# Patient Record
Sex: Male | Born: 1990 | Race: White | Hispanic: No | Marital: Married | Smoking: Former smoker
Health system: Southern US, Academic
[De-identification: ages and names within clinical notes are randomized; demographics above are authoritative.]

## PROBLEM LIST (undated history)

## (undated) DIAGNOSIS — F419 Anxiety disorder, unspecified: Secondary | ICD-10-CM

## (undated) DIAGNOSIS — Z973 Presence of spectacles and contact lenses: Secondary | ICD-10-CM

## (undated) DIAGNOSIS — I10 Essential (primary) hypertension: Secondary | ICD-10-CM

## (undated) HISTORY — DX: Anxiety disorder, unspecified: F41.9

## (undated) HISTORY — PX: HX WISDOM TEETH EXTRACTION: SHX21

## (undated) HISTORY — DX: Essential (primary) hypertension: I10

## (undated) HISTORY — PX: HX TONSILLECTOMY: SHX27

## (undated) NOTE — Progress Notes (Signed)
 Formatting of this note might be different from the original.  Subjective   Patient ID: Benjamin Mcmahon is a 23 y.o. male presenting to the Urgent Care with a chief complaint of Back Pain (Lower back pain x years.  Flare up today.).    Pt reports left back pain in lower lumbar region with that is chronic in nature, flare this morning having trouble sitting and finding a comfortable position. No known injury     History provided by:  Patient  Back Pain  This is a recurrent problem. The current episode started today. The problem occurs constantly. The problem is unchanged. The pain is present in the lumbar spine. The quality of the pain is described as burning and shooting. The pain radiates to the left knee and left thigh. The pain is at a severity of 3/10. The pain is moderate. The pain is The same all the time. The symptoms are aggravated by sitting and position. Stiffness is present All day. He has tried nothing for the symptoms. The treatment provided no relief.     Objective   BP (!) 150/90   Pulse 94   Temp 37.3 C (99.2 F) (Temporal)   Resp 19   Ht 1.854 m (6' 1)   Wt 111 kg (245 lb)   SpO2 96%   BMI 32.32 kg/m     Physical Exam  Constitutional:       Appearance: Normal appearance. He is normal weight.   HENT:      Head: Normocephalic.      Right Ear: Tympanic membrane, ear canal and external ear normal.      Left Ear: Tympanic membrane, ear canal and external ear normal.      Nose: Nose normal.      Mouth/Throat:      Mouth: Mucous membranes are dry.      Pharynx: Oropharynx is clear.   Eyes:      Conjunctiva/sclera: Conjunctivae normal.   Cardiovascular:      Rate and Rhythm: Normal rate and regular rhythm.      Pulses: Normal pulses.      Heart sounds: Normal heart sounds.   Pulmonary:      Effort: Pulmonary effort is normal.      Breath sounds: Normal breath sounds.   Abdominal:      General: Bowel sounds are normal.      Palpations: Abdomen is soft.   Musculoskeletal:         General: Tenderness  present.      Comments: Left lumbar spine   Bilateral sciatica    Skin:     General: Skin is warm and dry.   Neurological:      General: No focal deficit present.      Mental Status: He is alert and oriented to person, place, and time. Mental status is at baseline.   Psychiatric:         Mood and Affect: Mood normal.         Behavior: Behavior normal.         Thought Content: Thought content normal.         Judgment: Judgment normal.         Assessment & Plan    Assessment & Plan  Bilateral sciatica    Orders:    ketorolac  (Toradol ) injection 30 mg    dexAMETHasone  (Decadron ) injection 10 mg    ketorolac  (Toradol ) injection 30 mg    methylPREDNISolone  (Medrol  Dospak) 4 MG tablets; See  instructions. Take by mouth as directed on label    cyclobenzaprine  (Flexeril ) 5 MG tablet; Take 1 tablet by mouth 3 times a day as needed for muscle spasms for up to 5 days. Do not drive or operate heavy machinery    diclofenac (Voltaren) 75 MG EC tablet; Take 1 tablet by mouth in the morning and 1 tablet before bedtime. Do not crush, chew, or split.    Lumbar spine pain    Orders:    ketorolac  (Toradol ) injection 30 mg    dexAMETHasone  (Decadron ) injection 10 mg    ketorolac  (Toradol ) injection 30 mg    methylPREDNISolone  (Medrol  Dospak) 4 MG tablets; See instructions. Take by mouth as directed on label    cyclobenzaprine  (Flexeril ) 5 MG tablet; Take 1 tablet by mouth 3 times a day as needed for muscle spasms for up to 5 days. Do not drive or operate heavy machinery    diclofenac (Voltaren) 75 MG EC tablet; Take 1 tablet by mouth in the morning and 1 tablet before bedtime. Do not crush, chew, or split.        In-House Lab Results:   No results found for this or any previous visit.     In-House Imaging Reads:        Procedure Documentation:  Procedures     ED Course & MDM   MDM - Medical Decision Making: Reviewed previous Chart  Electronically signed by Avelina Kerns, CRNP at 03/22/2024  1:25 PM EDT

---

## 2020-09-18 IMAGING — MR MRI BRAIN WITHOUT AND WITH CONTRAST
11 of 13 series · 36 of 48 positions shown · IV contrast (gadavist)
Comparison: None available.

﻿EXAM:  MRI BRAIN WITHOUT AND WITH CONTRAST
INDICATION: Suspected pituitary lesion.
TECHNIQUE: Multiplanar multisequential MRI of the brain and pituitary gland was performed without and with 5 mL of Gadavist.

[Series 5: DWI · axial · 5.0mm · 1.35mm/px · z∈[-21,+105]mm · 9 of 88 slices shown (1 of 3)]
[im 1/88]
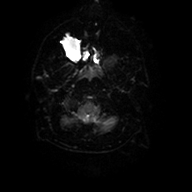
[im 16/88]
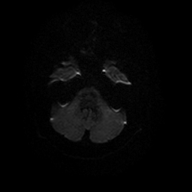
[im 24/88]
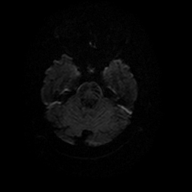
[im 40/88]
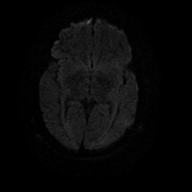
[im 48/88]
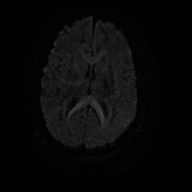
[im 64/88]
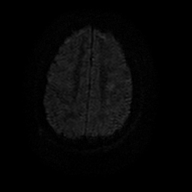
[im 72/88]
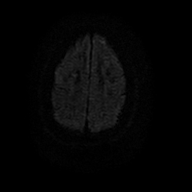
[im 80/88]
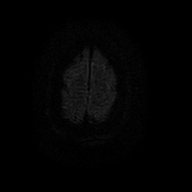
[im 88/88]
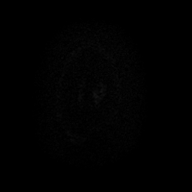

[Series 6: DWI · axial · 5.0mm · 1.35mm/px · z∈[-21,+105]mm · 2 of 22 slices shown (2 of 3)]
[im 1/22]
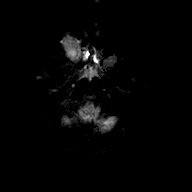
[im 22/22]
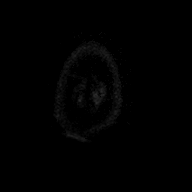

[Series 7: DWI · axial · 5.0mm · 1.35mm/px · z∈[-21,+105]mm · 2 of 20 slices shown (3 of 3)]
[im 1/20]
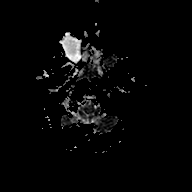
[im 20/20]
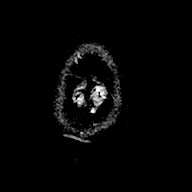

[Series 8: FLAIR · sagittal · 4.0mm · 0.75mm/px · 4 of 26 slices shown (1 of 2)]
[im 1/26]
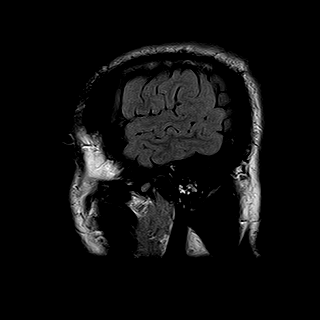
[im 9/26]
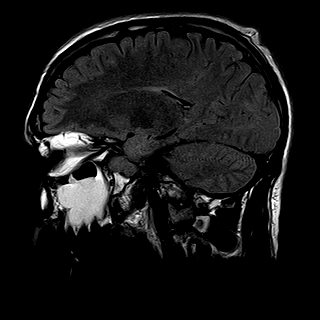
[im 17/26]
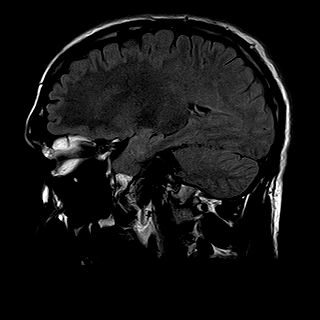
[im 26/26]
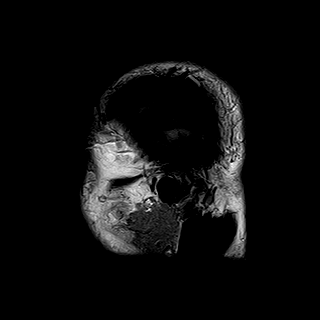

[Series 9: T2 · axial · 5.0mm · 0.43mm/px · z∈[-23,+120]mm · 4 of 25 slices shown]
[im 1/25]
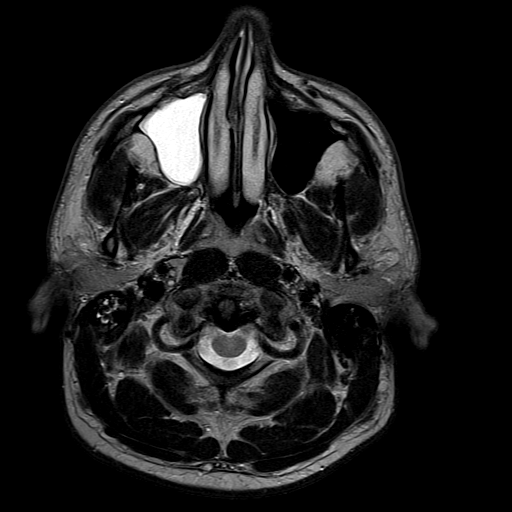
[im 9/25]
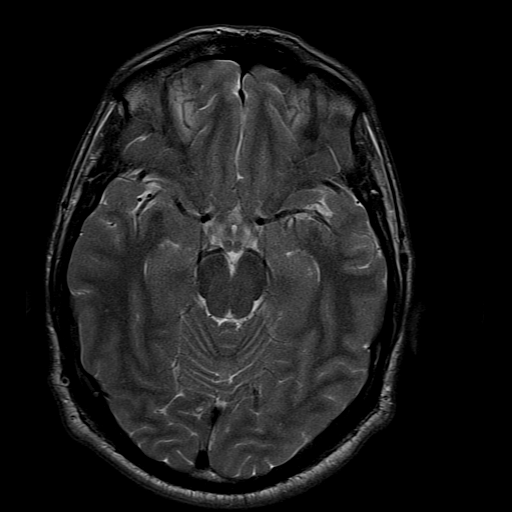
[im 17/25]
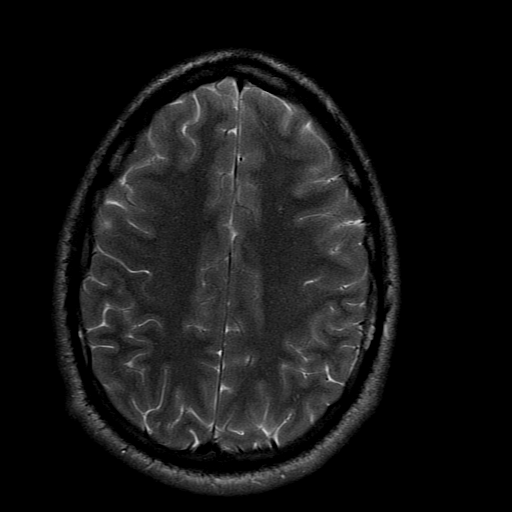
[im 25/25]
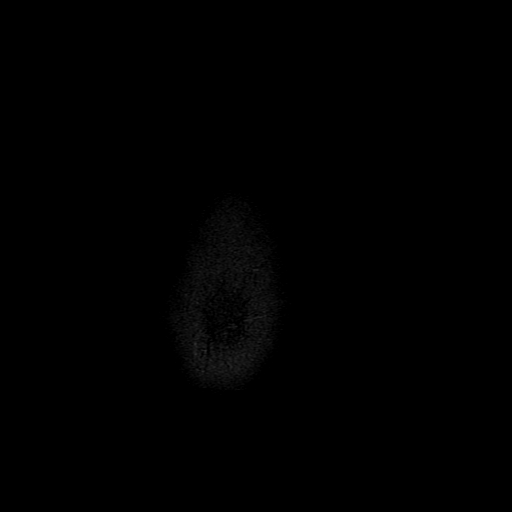

[Series 10: FLAIR · axial · 5.0mm · 0.43mm/px · z∈[-23,+120]mm · 4 of 25 slices shown (2 of 2)]
[im 1/25]
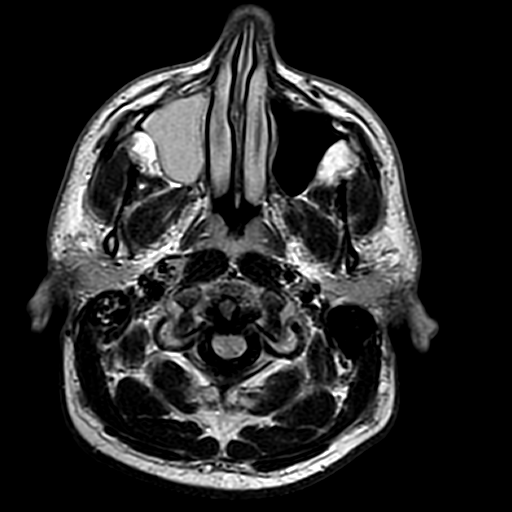
[im 9/25]
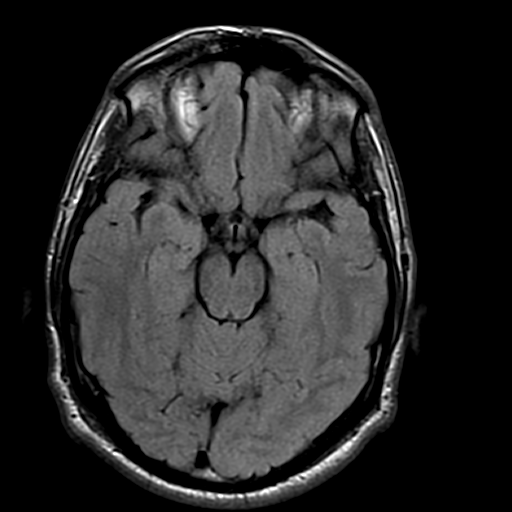
[im 17/25]
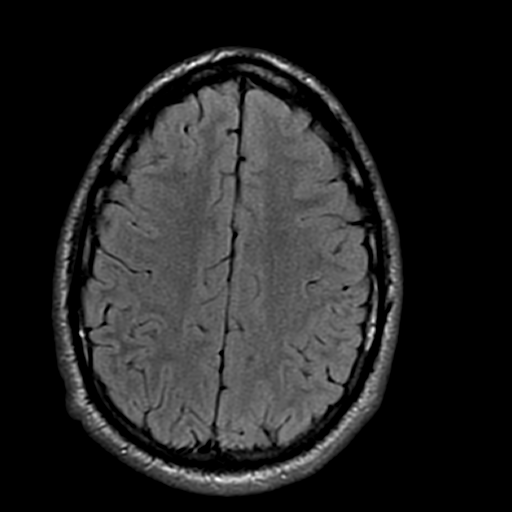
[im 25/25]
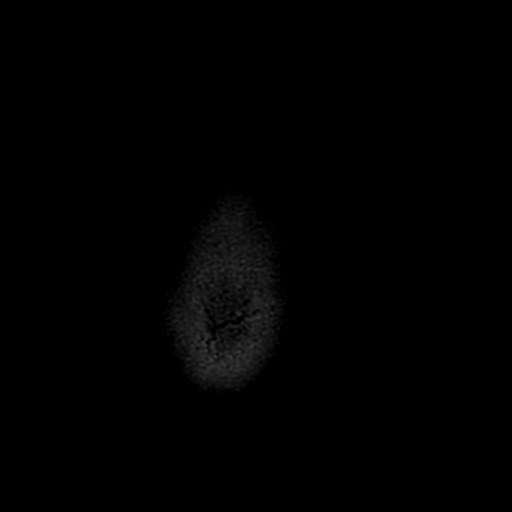

[Series 11: T1 · axial · 5.0mm · 0.43mm/px · z∈[-23,+120]mm · 4 of 25 slices shown (1 of 2)]
[im 1/25]
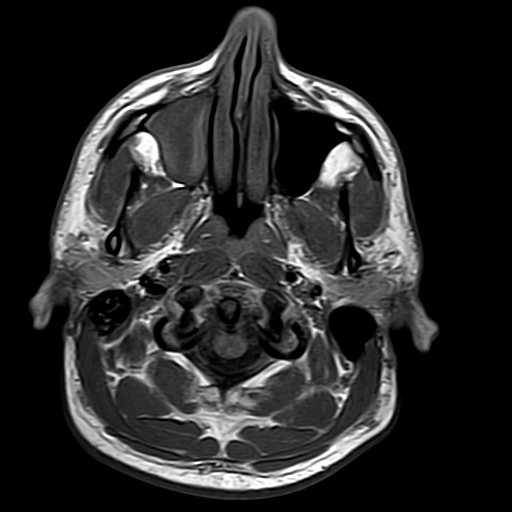
[im 9/25]
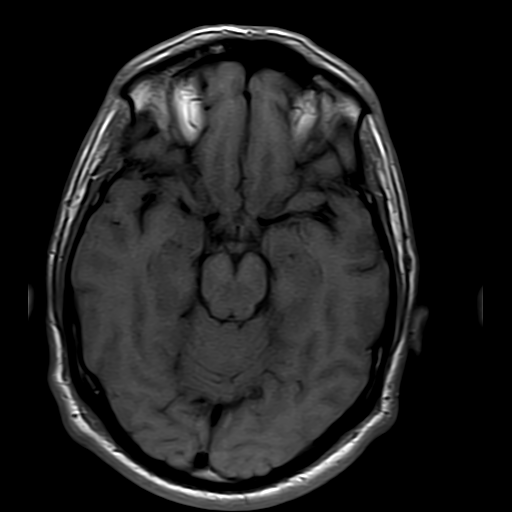
[im 17/25]
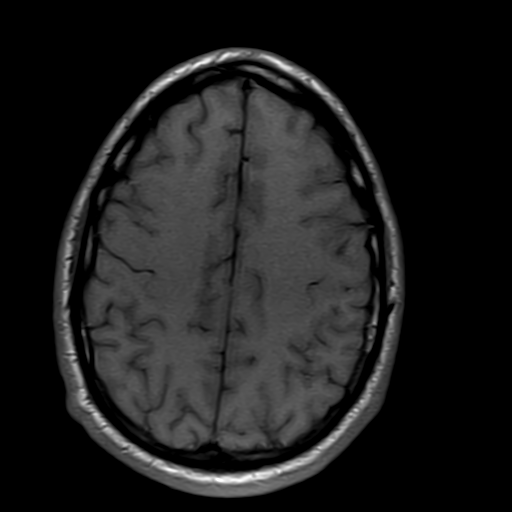
[im 25/25]
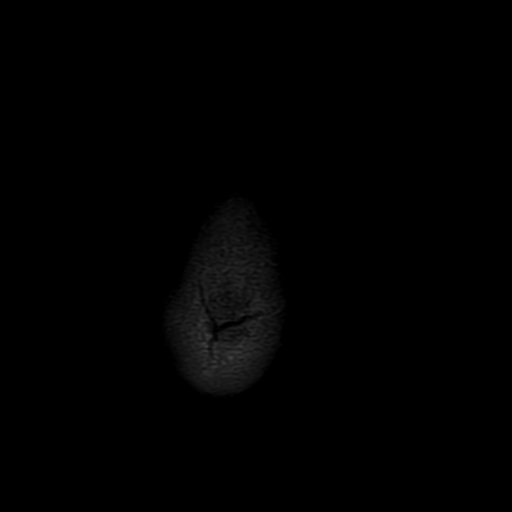

[Series 12: T1 · coronal · 3.0mm · 0.56mm/px · 2 of 13 slices shown (2 of 2)]
[im 1/13]
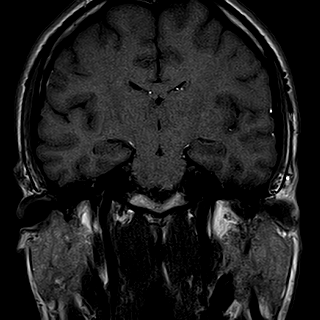
[im 13/13]
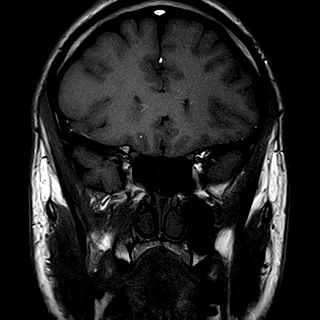

[Series 13: T1 fat-sat · axial · 5.0mm · 0.57mm/px · 1 of 25 slices shown]
[im 1/25]
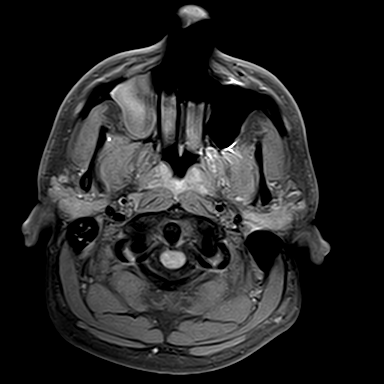

[Series 15: T1 post-contrast · coronal · 3.0mm · 0.56mm/px · 2 of 13 slices shown (1 of 2)]
[im 1/13]
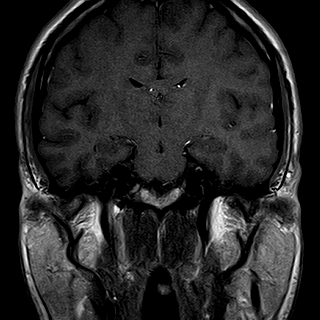
[im 13/13]
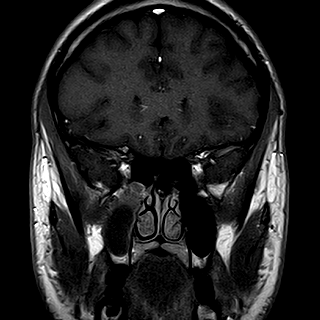

[Series 17: T1 post-contrast · sagittal · 3.0mm · 0.56mm/px · 2 of 13 slices shown (2 of 2)]
[im 1/13]
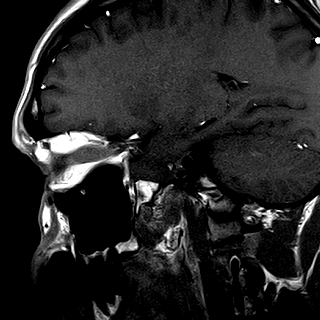
[im 13/13]
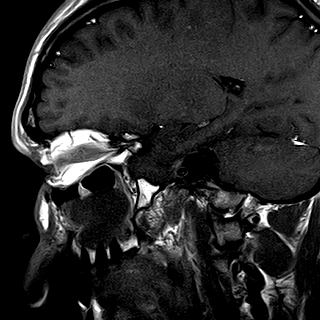

[36 of 48 positions shown; findings below may reference images not displayed]

FINDINGS: Ventricular and sulcal size is normal for the patient's age. There is no mass effect, midline shift or intracranial hemorrhage. There is no evidence of acute infarction. Skull base flow voids and basal cisterns are patent. There is an 8 x 7 mm enhancing mass within the central pituitary gland. Pituitary infundibulum is midline. No suprasellar extension or mass effect on the optic chiasm is identified. The cerebellar tonsils are also seen extending below the foramen magnum, measuring 7 mm. There is no abnormal parenchymal or leptomeningeal enhancement. There are no extra-axial fluid collections. There is near complete opacification of the right maxillary sinus and right mastoid air cells. Orbital contents are unremarkable.
IMPRESSION: 1. Imaging findings suggestive of pituitary microadenoma as detailed above. Continued follow-up is recommended. 

 2. Incidentally noted Chiari 1 malformation.

## 2020-10-01 ENCOUNTER — Encounter (INDEPENDENT_AMBULATORY_CARE_PROVIDER_SITE_OTHER): Payer: Self-pay | Admitting: Neurological Surgery

## 2020-10-01 ENCOUNTER — Ambulatory Visit: Payer: BLUE CROSS/BLUE SHIELD | Attending: Neurological Surgery | Admitting: Neurological Surgery

## 2020-10-01 ENCOUNTER — Other Ambulatory Visit: Payer: Self-pay

## 2020-10-01 VITALS — BP 150/86 | HR 59 | Temp 97.4°F | Ht 72.0 in | Wt 251.1 lb

## 2020-10-01 DIAGNOSIS — D352 Benign neoplasm of pituitary gland: Secondary | ICD-10-CM | POA: Insufficient documentation

## 2020-10-01 NOTE — Progress Notes (Signed)
I personally saw and examined the patient. See the mid-level provider's note for additional details. 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 Apparently was on cabergoline for a time.    Exam:  Awake alert motor intact.    Studies:  MRI shows 6 mm microadenoma inferior gland. Has low testosterone levels, one prolactin level slightly elevated at 20.    Imp:  Microadenoma.    Plan:  Recommend endocrine evaluation, ophthalmology for visual fields and evaluation of diplopia. Will review with patient by phone once workup completed.      Jarvis Morgan, MD 10/01/2020, 09:43

## 2020-10-01 NOTE — Progress Notes (Signed)
[facility] of Neurosurgery  New Outpatient/Consult    Benjamin Mcmahon  Date of Service: 10/01/2020  Referring physician: Shiela Mayer, MD  [address]  [place],  [place] [address]    Gender: male  Handedness: Right handed  Marital Status: Married   Job Title (or Former Job): Automotive Actuary Complaint:   Chief Complaint   Patient presents with   . New Patient     History is provided by patient and spouse     History of Present Illness  Benjamin Mcmahon is a 30 y.o. male who is here for a new patient visit referred by PCP-Dr. Meda Coffee to establish care and initiate work up if indicated for pituitary microadenoma seen on MRI brain done 09/18/20 with c/o headache and eye twitching.       Today's visit:  Today the patient is here for a new patient visit for evaluation of pituitary adenoma. The patient reports he took steroids for many years and during routine follow up his testosterone levels were low so he was started on testosterone injections for about year. He switched primary care to Dr. Meda Coffee who stopped testosterone about 1 month ago. Today he reports occasional double vision with both eyes open, chronic daily headaches that resides on their own, episode of dizziness last week while working outside, worsening short term memory loss, unintentional weight loss of 35lbs over past 1-2 months with decreased appetite. The patient denies fainting, paralysis, tremors or involuntary movements, unilateral weakness/numbness, recent falls or injuries.       Past History    Current Outpatient Medications:   .  aspirin 325 mg Oral Tablet, aspirin  325mg  1 tab daily, Disp: , Rfl:   .  propranoloL (INDERAL) 10 mg Oral Tablet, propranolol 10 mg tablet  TAKE 1 TABLET BY MOUTH THREE TIMES A DAY, Disp: , Rfl:   .  tamoxifen (NOLVADEX) 10 mg Oral Tablet, Take 10 mg by mouth Once a day, Disp: , Rfl:   No Known Allergies  Past Medical History:   Diagnosis Date   . Anxiety    . HTN (hypertension)        Past  Surgical History:   Procedure Laterality Date   . HX TONSILLECTOMY     . HX WISDOM TEETH EXTRACTION         Family History  Family Medical History:    None           Social History  Social History     Socioeconomic History   . Marital status: Married   . Number of children: 0   Tobacco Use   . Smoking status: Current Some Day Smoker     Types: Cigarettes   . Smokeless tobacco: Current User     Types: Snuff   Vaping Use   . Vaping Use: Former   Substance and Sexual Activity   . Alcohol use: Yes     Comment: weekends    . Drug use: Yes     Types: Marijuana, Anabolic Steroids   Social History Narrative    Right handed        Review of Systems  Other than ROS in the HPI, all other systems were negative.      Examination  BP (!) 150/86   Pulse 59   Temp 36.3 C (97.4 F)   Ht 1.829 m (6')   Wt 114 kg (251 lb 1.7 oz)   BMI 34.06 kg/m  Constitutional:Well groomed, in no apparent distress  Head:  Normocephalic/ Atraumatic.   Eyes: Conjunctiva clear, + red reflex, optic discs and posterior segments - difficult to visualize, no obvious abnormalities;   ENT:  Tongue midline, trachea midline   Cardiovascular:    Auscultation: Regular rate and rhythm  Respiratory: CTA bilaterally, Unlabored  Gastrointestinal: Not done  Hem/Lymph:  Deferred  Musculoskeletal  Gait and Station: slow and steady with no ataxia  Strength:  Arm Right Left Leg Right Left   Deltoid (shoulder abduction) 5/5 5/5 Hip Flexion 5/5 5/5   Biceps (Elbow flexion) 5/5 5/5 Knee Flexion 5/5 5/5   Triceps (Elbow extension) 5/5 5/5 Knee Extension 5/5 5/5   Grip 5/5 5/5 Foot Dorsiflexion 5/5 5/5      Foot Plantarflexion 5/5 5/5   Muscle tone (upper extremities): WNL  Muscle tone (lower extremities): WNL  Sensory: Sensory exam in the upper and lower extremities is normal  DTR's:    Brachio-  radialis Biceps Triceps Patellar Achilles Hoffman's   Right 2+ 2+ 2+ 2+ 2+ Not present   Left 2+ 2+ 2+ 2+ 2+ Not present   Ankle clonus: Negative  Coordination: Normal  rapid alternating movements and finger to nose intact. No pronator drift.   Musculoskeletal tenderness: Negative   Neurological  Level of consciousness: Alert and oriented  Recent and remote memory: Good recall and able to follow commands  Attention span and concentration: Normal in conversation  Language/Speech: No aphasia or dysarthria  Fund of knowledge: Appropriate in this setting  Cranial Nerves  2nd: PERRL  3rd,4th,6th:  EOMI, no nystagmus  5th: Facial sensation intact  7th: No facial asymmetry or droop  8th: Hearing grossly intact  9th/ 10th: Palate symmetric  11th: Normal shoulder shrug  12th: Tongue midline      Data reviewed    - Outside records/ Previous charts reviewed  MRI Brain done 09/18/20  Discussions with other providers: Patient history and presentation were discussed with Dr. Oliver Hum     Assessment:    ICD-10-CM    1. Pituitary adenoma (CMS [place])  D35.2 Referral to External Provider (AMB)     Referral to External Provider (AMB)       Treatment Plan  -  Benjamin Mcmahon is in agreement with the following plan:    - The natural history, film findings, and indications for treatment were discussed.   - The patient is doing well clinically and his films remain stable per Dr. Oliver Hum   - Outside referral to Endocrinology and ophthalmology for evaluation   - RTC after evaluation by Endocrine and Ophthalmology or sooner if problems develop.   - Continue Medical Management (Diet, Exercise, Medication).      - The patient has been advised to follow up with their PCP in regards any chronic medical conditions and any non-neurosurgical symptoms that they may have. A copy of the note from today's clinic appointment will be sent to the patient's PCP Shiela Mayer, MD on file (confirmed during visit)      The patient was seen as a shared visit with the co-signing faculty.    I independently of the faculty provider spent a total of (40) minutes in direct/indirect care of this patient including initial evaluation,  review of laboratory, radiology, diagnostic studies, review of medical record, order entry and coordination of care.    Neale Burly, APRN 10/04/2020, 11:40      With Dr. Oliver Hum

## 2020-11-28 ENCOUNTER — Ambulatory Visit (HOSPITAL_COMMUNITY): Payer: Self-pay | Admitting: Internal Medicine

## 2022-07-31 ENCOUNTER — Other Ambulatory Visit: Payer: Self-pay

## 2022-07-31 ENCOUNTER — Emergency Department (HOSPITAL_COMMUNITY): Payer: No Typology Code available for payment source

## 2022-07-31 ENCOUNTER — Emergency Department
Admission: EM | Admit: 2022-07-31 | Discharge: 2022-07-31 | Disposition: A | Discharge status: Home / Self Care | Payer: No Typology Code available for payment source | Attending: Family Medicine | Admitting: Family Medicine

## 2022-07-31 ENCOUNTER — Encounter (HOSPITAL_COMMUNITY): Payer: Self-pay

## 2022-07-31 DIAGNOSIS — S39012A Strain of muscle, fascia and tendon of lower back, initial encounter: Secondary | ICD-10-CM | POA: Insufficient documentation

## 2022-07-31 DIAGNOSIS — W010XXA Fall on same level from slipping, tripping and stumbling without subsequent striking against object, initial encounter: Secondary | ICD-10-CM | POA: Insufficient documentation

## 2022-07-31 MED ORDER — DEXAMETHASONE SODIUM PHOSPHATE (PF) 10 MG/ML INJECTION SOLUTION
10.0000 mg | INTRAMUSCULAR | Status: AC
  Administered 2022-07-31: 10 mg via INTRAMUSCULAR
  Filled 2022-07-31: qty 1

## 2022-07-31 MED ORDER — METHYLPREDNISOLONE 4 MG TABLETS IN A DOSE PACK
ORAL_TABLET | ORAL | 0 refills | Status: DC
Start: 2022-07-31 — End: 2022-08-12

## 2022-07-31 MED ORDER — CYCLOBENZAPRINE 10 MG TABLET
10.0000 mg | ORAL_TABLET | Freq: Three times a day (TID) | ORAL | 0 refills | Status: DC | PRN
Start: 2022-07-31 — End: 2022-08-12

## 2022-07-31 MED ORDER — KETOROLAC 60 MG/2 ML INTRAMUSCULAR SOLUTION
60.0000 mg | INTRAMUSCULAR | Status: AC
Start: 2022-07-31 — End: 2022-07-31
  Administered 2022-07-31: 60 mg via INTRAMUSCULAR
  Filled 2022-07-31: qty 2

## 2022-07-31 NOTE — ED Triage Notes (Signed)
Started: 1045 today.   Pt lifts weights (yesterday), worked (today)  Pt had a fall on stairs last week onto hip.

## 2022-07-31 NOTE — ED Nurses Note (Signed)
Discharged per orders, AVS printed and reviewed. Education provided, questions answered. Patient voiced understanding. Wheelchair out denied.

## 2022-07-31 NOTE — ED Provider Notes (Signed)
Emergency Department  07/31/2022     Benjamin Mcmahon  09/28/1990  32 y.o.  male  [place] [address]   [phone] (home)  PCP: No Pcp   Date of service:07/31/2022 21:26    Chief Complaint:   Chief Complaint   Patient presents with    Back Pain     lower           HPI: This is a 32 y.o. male who presents to the emergency department complaining of  low back pain status post fall on February 19th at home, patient states he slipped and fell on his buttocks sliding down approximately 3 steps, states he has had low back pain off and on since 2019 but since the fall it has been worse especially in the last 1-2 weeks, he states he does weight lift to stay in shape and has had worsening pain after coming back from the gym, he denies any heavy lifting of the lower extremities but states he can not hardly do any lifting of his upper extremities either, he denies any urinary or stool retention or incontinence, no numbness or tingling, no weakness in lower extremities, no radiation in lower extremities    Patient tells me he has had x-rays of the lumbar spine in the past and had sacralization of the L5 vertebrae but has never had an MRI    He has been taking naproxen at home with minimal, short term relief          Past Medical History:   Past Medical History:   Diagnosis Date    Anxiety     HTN (hypertension)      (Not in an outpatient encounter)     Past Surgical History:   Past Surgical History:   Procedure Laterality Date    Hx tonsillectomy      Hx wisdom teeth extraction         Social History:   Social History     Tobacco Use    Smoking status: Former     Types: Cigarettes    Smokeless tobacco: Current     Types: Snuff   Vaping Use    Vaping status: Every Day    Substances: Nicotine    Devices: Refillable tank   Substance Use Topics    Alcohol use: Yes     Comment: weekends     Drug use: Yes     Types: Marijuana, Anabolic Steroids     Comment: TRT        Family History:  No family history on file.        Medications Prior to  Admission       Prescriptions    naproxen (NAPROSYN) 250 mg Oral Tablet    Take 1 Tablet (250 mg total) by mouth Twice daily with food            Allergies: No Known Allergies    Above history reviewed with patient.  Allergies, medication list, reviewed.       Review of Systems - All other systems reviewed and are negative unless noted in HPI.      Physical exam:  Body mass index is 31.19 kg/m.  ED Triage Vitals [07/31/22 2106]   BP (Non-Invasive) (!) 153/82   Heart Rate 64   Respiratory Rate 16   Temperature 37.4 C (99.3 F)   SpO2 98 %   Weight 104 kg (230 lb)   Height 1.829 m (6')       Physical Exam  Constitutional:       Appearance: Normal appearance.   HENT:      Head: Normocephalic and atraumatic.   Cardiovascular:      Rate and Rhythm: Normal rate.      Pulses: Normal pulses.   Pulmonary:      Effort: Pulmonary effort is normal.   Musculoskeletal:         General: Tenderness present. No swelling or deformity.      Cervical back: Normal range of motion.      Comments: Pain with any range of motion of the lumbar spine, no spinous process tenderness, mild bilateral paraspinal soft tissue tenderness to palpation straight leg negative x2   Neurological:      Mental Status: He is alert.      Sensory: No sensory deficit.      Motor: No weakness.      Deep Tendon Reflexes: Reflexes normal.   Psychiatric:         Mood and Affect: Mood normal.         Thought Content: Thought content normal.         Judgment: Judgment normal.          The following orders were placed:  Orders Placed This Encounter    XR LUMBAR SPINE SERIES    ketorolac (TORADOL) 60mg /2 mL IM injection    dexAMETHasone (PF) 10 mg/mL injection    Methylprednisolone (MEDROL DOSEPACK) 4 mg Oral Tablets, Dose Pack    cyclobenzaprine (FLEXERIL) 10 mg Oral Tablet      XR LUMBAR SPINE SERIES   Final Result   No fracture or malalignment. Transitional lumbosacral vertebrae can be a source of chronic lower back pain.               Radiologist location ID:  [place] Ordered/Reviewed - No data to display   No results found for this or any previous visit (from the past 12 hour(s)).      ED Course:     ED Course as of 07/31/22 2156   Thu Jul 31, 2022   2152 XR LUMBAR SPINE SERIES  No fracture or malalignment      Medications Administered in the ED   ketorolac (TORADOL) 60mg /2 mL IM injection (has no administration in time range)   dexAMETHasone (PF) 10 mg/mL injection (has no administration in time range)        Emergency Department Procedure:    EKG Interpertation:    Procedures    ED events:            Clinical Impression:   Clinical Impression   Strain of lumbar region, initial encounter (Primary)       Disposition: Discharged        Medical Decision Making:    Ddx (including but not limited to):  Sprain strain contusion fracture dislocation disc herniation cauda equina syndrome diskitis osteomyelitis malignancy sepsis    Data (all images independently viewed by me): nurses notes and vital signs reviewed    Labs:  None    Imaging:  Sacralization of the L5 vertebrae with no apparent fracture, otherwise normal alignment    Records: n/a    Consult/Discussion:  Patient has worsening pain since his fall 1 month ago sounds like he may have a significant strain of a muscle were disc with possible disc herniation but no signs of radiculopathy, will start patient on steroid Dosepak have encouraged him to  avoid heavy lifting until his symptoms have resolved and he is cleared by a medical professional, patient to follow-up in 1 month to see if his symptoms have improved and determine if he needs to have physical therapy and or an MRI, can always return if his condition gets worse    Plan:  Discharge home      Findings and diagnosis discussed with patient/family who is/are agreeable with plan            No follow-ups on file.   New Prescriptions    CYCLOBENZAPRINE (FLEXERIL) 10 MG ORAL TABLET    Take 1 Tablet (10 mg total) by mouth Three times a day as needed  for Muscle spasms    METHYLPREDNISOLONE (MEDROL DOSEPACK) 4 MG ORAL TABLETS, DOSE PACK    Take as instructed.      [facility]  [place] [address]  [phone]    As needed    [facility]  [phone]  Schedule an appointment as soon as possible for a visit on 09/01/2022  for recheck      Future Appointments  No future appointments.     BP (!) 153/82   Pulse 64   Temp 37.4 C (99.3 F)   Resp 16   Ht 1.829 m (6')   Wt 104 kg (230 lb)   SpO2 98%   BMI 31.19 kg/m        Benjamin Mcmahon W. Nicole Kindred DO 07/31/2022              This note was partially created using voice recognition software and is inherently subject to errors including those of syntax and "sound alike " substitutions which may escape proof reading.  In such instances, original meaning may be extrapolated by contextual derivation.

## 2022-07-31 NOTE — Discharge Instructions (Addendum)
Do not lift anything above 25 lb until cleared to do so    Avoid bending or twisting at the waist    Okay to take over-the-counter Tylenol in addition to her current medications as needed for pain, dose is 1 g every 6 hours by mouth as needed.

## 2022-08-12 ENCOUNTER — Ambulatory Visit
Payer: No Typology Code available for payment source | Attending: Student in an Organized Health Care Education/Training Program | Admitting: Student in an Organized Health Care Education/Training Program

## 2022-08-12 ENCOUNTER — Other Ambulatory Visit: Payer: Self-pay

## 2022-08-12 ENCOUNTER — Encounter (RURAL_HEALTH_CENTER): Payer: Self-pay | Admitting: Student in an Organized Health Care Education/Training Program

## 2022-08-12 VITALS — BP 150/83 | HR 81 | Temp 98.0°F | Resp 17 | Ht 72.01 in | Wt 239.0 lb

## 2022-08-12 DIAGNOSIS — N529 Male erectile dysfunction, unspecified: Secondary | ICD-10-CM | POA: Insufficient documentation

## 2022-08-12 DIAGNOSIS — I10 Essential (primary) hypertension: Secondary | ICD-10-CM

## 2022-08-12 DIAGNOSIS — D352 Benign neoplasm of pituitary gland: Secondary | ICD-10-CM | POA: Insufficient documentation

## 2022-08-12 DIAGNOSIS — E669 Obesity, unspecified: Secondary | ICD-10-CM | POA: Insufficient documentation

## 2022-08-12 DIAGNOSIS — Z0000 Encounter for general adult medical examination without abnormal findings: Secondary | ICD-10-CM

## 2022-08-12 DIAGNOSIS — Z6832 Body mass index (BMI) 32.0-32.9, adult: Secondary | ICD-10-CM | POA: Insufficient documentation

## 2022-08-12 DIAGNOSIS — E291 Testicular hypofunction: Secondary | ICD-10-CM

## 2022-08-12 DIAGNOSIS — F17290 Nicotine dependence, other tobacco product, uncomplicated: Secondary | ICD-10-CM | POA: Insufficient documentation

## 2022-08-12 NOTE — Progress Notes (Signed)
[facility]  [address] [place]  [place] [address]  [phone]   Progress Note    Date of Service:  08/12/2022  Benjamin Mcmahon, 32 y.o. male  Date of Birth:  Nov 27, 1990  PCP: Sallye Ober, MD    Chief Complaint:  New Patient and Establish Care       HPI:  Benjamin Mcmahon is a 32 y.o. White male   seen in the [facility] for the first time to establish care.      Medications reconciled and current.   He is here today with his wife, who is a Tree surgeon  Works at a [employer].    HTN  - says that he thinks that this was an issue as a teenager due to his symptoms  - has been on lisinopril 10 in the past but says that it made him feel tired so he did not continue    Pituitary Microadenoma:  - had had an MRI done in [place]  - Saw Dr. Meda Coffee in [place] - for about 1 year. Unclear if she is endocrinology? She had ordered the MRI and had told him that he had a pituitary tumor and had stopped all the T and other medications for a few weeks and he had extensive blood testing. He says that she wanted to test for cushings but he never did that. He then did not follow up with her and started himself back on T  - saw Neurosurgery Dr. Oliver Hum in 2023 and was told that the tumor was very small and that she should be established with an Endo.  - says that he had been on cabergoline but he is not sure when or for how long    Hypogonadism:  - pt says that he feels that he did not have any pubertal changes or adult male characteristics develop until the age of 59 when he started exogenous testosterone. Says that he has always had very small testicles, had ED as a young adult, developed large breasts as a teenager and has had galactorrhea at different points in his life. Did not have any hair growth in pubic area or beard growth until he started himself on T  - he says that his mom never took him to the doctor and he did not have regular  exams as a child or a teen.   - He started himself at 24 at 200 mg testosterone/week and has been on varying amounts ever since. He says that someone has prescribed him clomide before but that it had had no effect on him. He listed various medications and street substances he has taken in the past  - ED: says that he has had trouble getting and maintaining erections since his teen years. This had improved with T but he feels that now it is again getting more difficult. Has been taking Cialis from off the street and this does improve his sexual function. Had tried Viagra in the past but says that he gave him an intense headache    History of back surgery: L 5 and sacral fusion    Anxiety    Says that he has been told he has high Hgb    Social:  - tobacco: no smoking. Chewed for 15 years but quit  - does vape quite a big   - smokes 1 g marijuana/day has his medical  - has used injection T but no  other illicit substance      Past Medical History:   Diagnosis Date    Anxiety     HTN (hypertension)       Past Surgical History:   Procedure Laterality Date    HX TONSILLECTOMY      HX WISDOM TEETH EXTRACTION        Social History     Tobacco Use    Smoking status: Former     Types: Cigarettes     Passive exposure: Past    Smokeless tobacco: Current     Types: Snuff   Vaping Use    Vaping status: Every Day    Substances: Nicotine, Flavoring    Devices: Refillable tank   Substance Use Topics    Alcohol use: Yes     Comment: weekends     Drug use: Yes     Types: Marijuana, Anabolic Steroids     Comment: TRT       Family Medical History:    None        Outpatient Medications Marked as Taking for the 08/12/22 encounter (Office Visit) with Sallye Ober, MD   Medication Sig    tadalafil (CIALIS) 10 mg Oral Tablet Take 1 Tablet (10 mg total) by mouth Every 24 hours as needed for Other    testosterone enanthate (DELATESTRYL) 200 mg/mL IntraMUSCULAR Oil Inject 0.25 mL (50 mg total) into the muscle Every 7 days      No Known Allergies      Review of Systems:  Positive findings addressed in HPI      PHYSICAL EXAM:   Vitals: BP (!) 150/83   Pulse 81   Temp 36.7 C (98 F) (Temporal)   Resp 17   Ht 1.829 m (6' 0.01")   Wt 108 kg (239 lb)   SpO2 98%   BMI 32.41 kg/m       Physical Exam  Constitutional:       Appearance: Normal appearance.   Cardiovascular:      Rate and Rhythm: Normal rate and regular rhythm.      Heart sounds: No murmur heard.  Pulmonary:      Effort: Pulmonary effort is normal.      Breath sounds: Normal breath sounds. No wheezing.   Abdominal:      General: Bowel sounds are normal.      Palpations: Abdomen is soft.      Tenderness: There is no abdominal tenderness.   Musculoskeletal:         General: No swelling.      Cervical back: No tenderness.   Lymphadenopathy:      Cervical: No cervical adenopathy.   Skin:     General: Skin is warm and dry.   Neurological:      General: No focal deficit present.      Mental Status: He is alert and oriented to person, place, and time.   Psychiatric:         Mood and Affect: Mood normal.         Behavior: Behavior normal.          Data Review:  Pertinent laboratory data and imaging studies reviewed.      Assessment/Plan:    (Z00.00) Encounter for medical examination to establish care  (primary encounter diagnosis)      (E66.9) Obesity (BMI 30.0-34.9)  Plan: LIPID PANEL, HGA1C (HEMOGLOBIN A1C WITH EST AVG        GLUCOSE), COMPREHENSIVE METABOLIC PANEL,  NON-FASTING  - BMI is actually elevated due to large amount of muscle mass, not increased adiposity    (D35.2) Pituitary adenoma (CMS HCC)  Plan: TESTOSTERONE, TOTAL, BIOAVAILABLE, AND FREE         WITH SHBG, SERUM, FSH, LH, Prolactin, Refer to         [facility]in[facility],         CORTISOL, PLASMA OR SERUM,  - very unclear history with many health care encounters, and it seems no definitive treatment that pt has maintained  - will get above testing, pt told that he is to get it first thing in the morning  - refer  to endo  - we had a length discussion about the risks of exogenous testosterone. He says that his goal is to feel well but agrees that the life-shortening risks need mitigated. 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    (E29.1) Testosterone deficiency in male  Plan: TESTOSTERONE, TOTAL, BIOAVAILABLE, AND FREE         WITH SHBG, SERUM, FSH, LH, Prolactin, Refer to         [facility]in[facility],   - see above    (N52.9) Erectile dysfunction, unspecified erectile dysfunction type  Plan: related to hypogonadism. Pt taking non prescribed Cialis. Likely multifactorial.     (E83.119) Hemochromatosis, unspecified hemochromatosis type  Plan: LIPID PANEL, COMPREHENSIVE METABOLIC PANEL,         NON-FASTING, CBC    (I10) Hypertension, unspecified type  Plan: Urine Microalbumin/Creatinine Ratio, Random  - elevated in office today. Will need started on medications, will discuss next visit        Depression screening is negative. PHQ 2 Total: 0       Return in about 1 month (around 09/12/2022).    Orders Placed This Encounter    TESTOSTERONE, TOTAL, BIOAVAILABLE, AND FREE WITH SHBG, SERUM    FSH    LH    CANCELED: ACTH STIMULATION, 0 MIN CORTISOL    Prolactin    LIPID PANEL    HGA1C (HEMOGLOBIN A1C WITH EST AVG GLUCOSE)    COMPREHENSIVE METABOLIC PANEL, NON-FASTING    CBC    CORTISOL, PLASMA OR SERUM    Urine Microalbumin/Creatinine Ratio, Random    Refer to Endocr[facility]      This is an established Patient. Pt has been seen in last 3 years.   Sallye Ober, MD  08/12/2022 11:53

## 2022-08-19 ENCOUNTER — Encounter (RURAL_HEALTH_CENTER): Payer: Self-pay | Admitting: Student in an Organized Health Care Education/Training Program

## 2022-08-19 ENCOUNTER — Ambulatory Visit
Payer: No Typology Code available for payment source | Attending: Student in an Organized Health Care Education/Training Program | Admitting: Student in an Organized Health Care Education/Training Program

## 2022-08-19 ENCOUNTER — Other Ambulatory Visit: Payer: Self-pay

## 2022-08-19 ENCOUNTER — Other Ambulatory Visit
Payer: No Typology Code available for payment source | Attending: Student in an Organized Health Care Education/Training Program

## 2022-08-19 VITALS — BP 141/74 | HR 63 | Temp 97.8°F | Resp 17 | Ht 72.01 in | Wt 241.0 lb

## 2022-08-19 DIAGNOSIS — D352 Benign neoplasm of pituitary gland: Secondary | ICD-10-CM | POA: Insufficient documentation

## 2022-08-19 DIAGNOSIS — I10 Essential (primary) hypertension: Secondary | ICD-10-CM | POA: Insufficient documentation

## 2022-08-19 DIAGNOSIS — E669 Obesity, unspecified: Secondary | ICD-10-CM | POA: Insufficient documentation

## 2022-08-19 DIAGNOSIS — F17290 Nicotine dependence, other tobacco product, uncomplicated: Secondary | ICD-10-CM | POA: Insufficient documentation

## 2022-08-19 DIAGNOSIS — E291 Testicular hypofunction: Secondary | ICD-10-CM | POA: Insufficient documentation

## 2022-08-19 DIAGNOSIS — J320 Chronic maxillary sinusitis: Secondary | ICD-10-CM | POA: Insufficient documentation

## 2022-08-19 DIAGNOSIS — M25511 Pain in right shoulder: Secondary | ICD-10-CM | POA: Insufficient documentation

## 2022-08-19 DIAGNOSIS — M545 Low back pain, unspecified: Secondary | ICD-10-CM | POA: Insufficient documentation

## 2022-08-19 DIAGNOSIS — G8929 Other chronic pain: Secondary | ICD-10-CM | POA: Insufficient documentation

## 2022-08-19 LAB — COMPREHENSIVE METABOLIC PANEL, NON-FASTING
ALBUMIN: 3.9 g/dL (ref 3.5–5.0)
ALKALINE PHOSPHATASE: 73 U/L (ref 45–115)
ALT (SGPT): 40 U/L (ref 10–55)
ANION GAP: 8 mmol/L (ref 4–13)
AST (SGOT): 47 U/L — ABNORMAL HIGH (ref 8–45)
BILIRUBIN TOTAL: 0.3 mg/dL (ref 0.3–1.3)
BUN/CREA RATIO: 10 (ref 6–22)
BUN: 10 mg/dL (ref 8–25)
CALCIUM: 9.2 mg/dL (ref 8.6–10.2)
CHLORIDE: 109 mmol/L (ref 96–111)
CO2 TOTAL: 24 mmol/L (ref 22–30)
CREATININE: 1.05 mg/dL (ref 0.75–1.35)
ESTIMATED GFR - MALE: >90 mL/min/BSA (ref >=60)
GLUCOSE: 106 mg/dL (ref 65–125)
POTASSIUM: 3.9 mmol/L (ref 3.5–5.1)
PROTEIN TOTAL: 6.9 g/dL (ref 6.4–8.3)
SODIUM: 141 mmol/L (ref 136–145)

## 2022-08-19 LAB — CBC
HCT: 48.9 % (ref 38.9–52.0)
HGB: 16.8 g/dL (ref 13.4–17.5)
MCH: 30.3 pg (ref 26.0–32.0)
MCHC: 34.4 g/dL (ref 31.0–35.5)
MCV: 88.3 fL (ref 78.0–100.0)
MPV: 10.9 fL (ref 8.7–12.5)
PLATELETS: 197 10*3/uL (ref 150–400)
RBC: 5.54 10*6/uL (ref 4.50–6.10)
RDW-CV: 14.6 % (ref 11.5–15.5)
WBC: 6.5 10*3/uL (ref 3.7–11.0)

## 2022-08-19 LAB — LIPID PANEL
CHOL/HDL RATIO: 4.1
CHOLESTEROL: 148 mg/dL (ref 100–200)
HDL CHOL: 36 mg/dL — ABNORMAL LOW (ref >=50)
LDL CALC: 92 mg/dL (ref <100)
NON-HDL: 112 mg/dL (ref <=190)
TRIGLYCERIDES: 109 mg/dL (ref <150)
VLDL CALC: 17 mg/dL (ref <30)

## 2022-08-19 LAB — MICROALBUMIN/CREATININE RATIO, URINE, RANDOM
CREATININE RANDOM URINE: 110 mg/dL
MICROALBUMIN RANDOM URINE: 0.6 mg/dL
MICROALBUMIN/CREATININE RATIO RANDOM URINE: 5.5 mg/g (ref <30.0)

## 2022-08-19 LAB — HGA1C (HEMOGLOBIN A1C WITH EST AVG GLUCOSE)
ESTIMATED AVERAGE GLUCOSE: 117 mg/dL
HEMOGLOBIN A1C: 5.7 % (ref 4.0–6.0)

## 2022-08-19 MED ORDER — FLUTICASONE PROPIONATE 50 MCG/ACTUATION NASAL SPRAY,SUSPENSION
1.0000 | Freq: Every day | NASAL | 0 refills | Status: DC
Start: 2022-08-19 — End: 2022-09-22

## 2022-08-19 NOTE — Progress Notes (Signed)
[facility]  [address] [place]  [place] [address]  [phone]   Progress Note    Date of Service:  08/19/2022  Lachlan, Speir, 32 y.o. male  Date of Birth:  11-17-1990  PCP: Sallye Ober, MD    Chief Complaint:  Follow Up (1 week follow up)       HPI:  Eliana Friedel is a 32 y.o. White male  with a PMH of HTN, pituitary microadenoma, hypogonadism, . They are seen in the [facility] for follow up.    Medications reconciled and current.     Pt had labs drawn this morning... results are not back yet.    Low back pain:  - very chronic, had back pain even as a kid, had trouble with football practice. He says that he wasn't able to lift weights at this age due to back pain. 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 He says that he often bench presses 200+ lbs and doing this will aggravate his low back pain. He does not train lower body at all  - last month had back pain so severe that he had gone to the ER. Had an XR that showed sacralization of his right side. He feels like one leg is longer than the other.  - has never been to physical therapy. He expresses a lot of hesitance about seeing them  - pt had some agitation when discussing possibly decreasing the weight of his lifting for the purpose of improving his pain. He was not able to express the reason for this emotional response at this time    Chronic R shoulder pain:  - has popping and crunching sounds when moving his shoulder  - feels like it is drooped/rolled forward, he feels like that side of his upper body doesn't function correctly.    Pituitary Microadenoma:  - had MRIs over a year ago. I do not have record here however pt had screen shots showing the 0.8cmx0.6cm lesion, no impression on the chiasm    Chronic PND:  - has chronic drainage  during the night. Causes nausea in the morning that improves when he clears the drainage. MRI findings incidentally showed R chronic sinusitis      Past Medical History:   Diagnosis Date    Anxiety     HTN (hypertension)       Past Surgical History:   Procedure Laterality Date    HX TONSILLECTOMY      HX WISDOM TEETH EXTRACTION        Social History     Tobacco Use    Smoking status: Former     Types: Cigarettes     Passive exposure: Past    Smokeless tobacco: Current     Types: Snuff   Vaping Use    Vaping status: Every Day    Substances: Nicotine, Flavoring    Devices: Refillable tank   Substance Use Topics    Alcohol use: Yes     Comment: weekends     Drug use: Yes     Types: Marijuana, Anabolic Steroids     Comment: TRT       Family Medical History:    None        Outpatient Medications Marked as Taking for the 08/19/22 encounter (Office Visit) with Resa Miner,  Shirlee Limerick, MD   Medication Sig    fluticasone propionate (FLONASE) 50 mcg/actuation Nasal Spray, Suspension Administer 1 Spray into each nostril Once a day    testosterone enanthate (DELATESTRYL) 200 mg/mL IntraMUSCULAR Oil Inject 0.25 mL (50 mg total) into the muscle Every 7 days      No Known Allergies     Review of Systems:  Positive findings addressed in HPI      PHYSICAL EXAM:   Vitals: BP (!) 141/74   Pulse 63   Temp 36.6 C (97.8 F) (Temporal)   Resp 17   Ht 1.829 m (6' 0.01")   Wt 109 kg (241 lb)   SpO2 97%   BMI 32.68 kg/m       Physical Exam  Constitutional:       Appearance: Normal appearance.   Cardiovascular:      Rate and Rhythm: Normal rate and regular rhythm.      Heart sounds: No murmur heard.  Pulmonary:      Effort: Pulmonary effort is normal.      Breath sounds: Normal breath sounds. No wheezing.   Musculoskeletal:         General: No swelling.      Right shoulder: Normal. No swelling, deformity, tenderness or crepitus. Normal range of motion. Normal strength.      Left shoulder: Normal. No swelling, deformity, tenderness or crepitus.  Normal range of motion. Normal strength.   Skin:     General: Skin is warm and dry.   Neurological:      General: No focal deficit present.      Mental Status: He is alert and oriented to person, place, and time.   Psychiatric:         Mood and Affect: Mood normal.         Behavior: Behavior normal.          Data Review:  Pertinent laboratory data and imaging studies reviewed.      Assessment/Plan:    (M54.50,  G89.29) Chronic low back pain  (primary encounter diagnosis)  Plan: XR does show sacralization of his right side. Does seem to have over anterior tilt of the pelvis.   - do strongly rec PT as he has imbalance of upper vs. Lower body strength. Will need to learn modified weighs to improve core and lower body strength with aggrevating pain  '- pt is not quite ready to visit PT yet    (M25.511,  G89.29) Chronic right shoulder pain  Plan: normal physical exam, rotator cuff in tact. Suspect labral issues with glenohumeral joint. 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    Orders Placed This Encounter    MRI SELLA W/WO CONTRAST    fluticasone propionate (FLONASE) 50 mcg/actuation Nasal Spray, Suspension          This is an established Patient. Pt has been seen in last 3 years.   Sallye Ober, MD  08/19/2022 11:21

## 2022-08-20 LAB — CORTISOL, PLASMA OR SERUM: CORTISOL: 16 ug/dL (ref 7.0–25.0)

## 2022-08-20 LAB — LH: LUTEINIZING HORMONE: <0.1 m[IU]/mL — ABNORMAL LOW (ref 0.5–12.0)

## 2022-08-20 LAB — FSH: FSH: <0.1 m[IU]/mL — ABNORMAL LOW (ref 1.0–12.0)

## 2022-08-20 LAB — PROLACTIN: PROLACTIN: 15.6 ng/mL (ref 3.5–19.4)

## 2022-08-22 LAB — TESTOSTERONE, TOTAL, BIOAVAILABLE, AND FREE WITH SHBG, SERUM
ALBUMIN: 4.3 g/dL (ref 3.6–5.1)
SEX HORMONE BINDING GLOB.: 11 nmol/L (ref 10–50)
TESTOSTERONE, BIOAVAILABLE: 78.2 ng/dL — ABNORMAL LOW (ref 110.0–575.0)
TESTOSTERONE, FREE: 39.7 pg/mL — ABNORMAL LOW (ref 46.0–224.0)
TESTOSTERONE,TOTAL,LC/MS/MS: 155 ng/dL — ABNORMAL LOW (ref 250–1100)

## 2022-09-03 ENCOUNTER — Telehealth (RURAL_HEALTH_CENTER): Payer: Self-pay | Admitting: Student in an Organized Health Care Education/Training Program

## 2022-09-03 NOTE — Telephone Encounter (Signed)
Pt confirmed appt for MRI scheduled for 09-04-22 @ 1:00 pm and to arrive early for registration. [facility] auth # [id number] valid 08/19/22-02/15/23.

## 2022-09-04 ENCOUNTER — Inpatient Hospital Stay
Admission: RE | Admit: 2022-09-04 | Discharge: 2022-09-04 | Disposition: A | Discharge status: Home / Self Care | Payer: No Typology Code available for payment source | Source: Ambulatory Visit | Attending: Student in an Organized Health Care Education/Training Program | Admitting: Student in an Organized Health Care Education/Training Program

## 2022-09-04 ENCOUNTER — Other Ambulatory Visit: Payer: Self-pay

## 2022-09-04 DIAGNOSIS — D352 Benign neoplasm of pituitary gland: Secondary | ICD-10-CM | POA: Insufficient documentation

## 2022-09-04 MED ORDER — GADOTERIDOL 279.3 MG/ML INTRAVENOUS SOLUTION
15.0000 mL | INTRAVENOUS | Status: AC
Start: 2022-09-04 — End: 2022-09-04
  Administered 2022-09-04: 15 mL via INTRAVENOUS
  Filled 2022-09-04: qty 15

## 2022-09-18 ENCOUNTER — Other Ambulatory Visit: Payer: Self-pay

## 2022-09-18 ENCOUNTER — Encounter (RURAL_HEALTH_CENTER): Payer: Self-pay | Admitting: Student in an Organized Health Care Education/Training Program

## 2022-09-18 ENCOUNTER — Ambulatory Visit
Payer: No Typology Code available for payment source | Attending: Student in an Organized Health Care Education/Training Program | Admitting: Student in an Organized Health Care Education/Training Program

## 2022-09-18 VITALS — BP 144/58 | HR 63 | Temp 98.0°F | Resp 17 | Ht 72.01 in | Wt 241.8 lb

## 2022-09-18 DIAGNOSIS — K219 Gastro-esophageal reflux disease without esophagitis: Secondary | ICD-10-CM | POA: Insufficient documentation

## 2022-09-18 DIAGNOSIS — F17290 Nicotine dependence, other tobacco product, uncomplicated: Secondary | ICD-10-CM | POA: Insufficient documentation

## 2022-09-18 DIAGNOSIS — F419 Anxiety disorder, unspecified: Secondary | ICD-10-CM | POA: Insufficient documentation

## 2022-09-18 DIAGNOSIS — I10 Essential (primary) hypertension: Secondary | ICD-10-CM | POA: Insufficient documentation

## 2022-09-18 DIAGNOSIS — B079 Viral wart, unspecified: Secondary | ICD-10-CM | POA: Insufficient documentation

## 2022-09-18 DIAGNOSIS — N529 Male erectile dysfunction, unspecified: Secondary | ICD-10-CM | POA: Insufficient documentation

## 2022-09-18 DIAGNOSIS — J329 Chronic sinusitis, unspecified: Secondary | ICD-10-CM | POA: Insufficient documentation

## 2022-09-18 MED ORDER — IMIQUIMOD 5 % TOPICAL CREAM PACKET
1.0000 | TOPICAL_CREAM | CUTANEOUS | 1 refills | Status: AC
Start: 2022-09-19 — End: 2022-11-14

## 2022-09-18 MED ORDER — LEVOCETIRIZINE 5 MG TABLET
5.0000 mg | ORAL_TABLET | Freq: Every evening | ORAL | 3 refills | Status: AC
Start: 2022-09-18 — End: ?

## 2022-09-18 MED ORDER — TADALAFIL 10 MG TABLET
10.0000 mg | ORAL_TABLET | ORAL | 0 refills | Status: AC | PRN
Start: 2022-09-18 — End: 2022-10-18

## 2022-09-18 NOTE — Progress Notes (Signed)
[place] [facility]  FAMILY MEDICINE, [facility]  [address] [place]  [place] [address]  [phone]   Progress Note    Date of Service:  09/18/2022  Benjamin Mcmahon, 32 y.o. male  Date of Birth:  1990-12-04  PCP: [name], MD    Chief Complaint:  Follow Up (1 MONTH FOLLOW UP)       HPI:  Benjamin Mcmahon is a 32 y.o. White male  with a PMH of HTN, pituitary microadenoma, hypogonadism, . They are seen in the [facility] for follow up.    Medications reconciled and current.       Pituitary microadenoma, hypogonadism:  - reviewed labs with pt. Likely not a prolactinoma  - low FSH, LH likely related to exogenous T use long standing for many years.   - pending endo referral.  - at this time pt has been taking non-regulated testosterone. He is interested in being treated medically and possibly coming off if able. He is very concerned about this given his significant ED, for which he takes non-regulated cialis    Low Back Pain:  - has decreased weight he is bench pressing and his pain has resolved  - he is thinking more seriously about how helpful (or unhelpful) lifting very very heavy is for his body, thinking about the health of himself over all as he older, priorities changes from aesthetics to functional fitness    Sinusitis:  - known sinusitis seen on brain imaging  - started on Flonase last visit, but he feels that it has not helped his PND  - has tried nasal rinses but the fluid just seems to stay in his sinuses  - has had tonsilectomy at [facility] in [place], but I think that this dentistry      Past Medical History:   Diagnosis Date    Anxiety     HTN (hypertension)       Past Surgical History:   Procedure Laterality Date    HX TONSILLECTOMY      HX WISDOM TEETH EXTRACTION        Social History     Tobacco Use    Smoking status: Former     Types: Cigarettes     Passive exposure: Past    Smokeless tobacco: Current     Types: Snuff   Vaping  Use    Vaping status: Every Day    Substances: Nicotine, Flavoring    Devices: Refillable tank   Substance Use Topics    Alcohol use: Yes     Comment: weekends     Drug use: Yes     Types: Marijuana, Anabolic Steroids     Comment: TRT       Family Medical History:    None        Outpatient Medications Marked as Taking for the 09/18/22 encounter (Office Visit) with [name], MD   Medication Sig    fluticasone propionate (FLONASE) 50 mcg/actuation Nasal Spray, Suspension Administer 1 Spray into each nostril Once a day    [START ON 09/19/2022] imiquimod (ALDARA) 5 % Cream in Packet Apply 1 Packet topically Every Monday, Wednesday and Friday for 56 days apply a thin layer as directed Indications: external genital wart    Levocetirizine (XYZAL) 5 mg Oral Tablet Take 1 Tablet (5 mg total) by mouth Every evening Indications: inflammation of the nose due to an allergy    tadalafil (CIALIS) 10 mg Oral Tablet Take  1 Tablet (10 mg total) by mouth Every 24 hours as needed for up to 30 days Indications: the inability to have an erection    testosterone enanthate (DELATESTRYL) 200 mg/mL IntraMUSCULAR Oil Inject 0.25 mL (50 mg total) into the muscle Every 7 days      No Known Allergies     Review of Systems:  Positive findings addressed in HPI      PHYSICAL EXAM:   Vitals: BP (!) 144/58   Pulse 63   Temp 36.7 C (98 F) (Temporal)   Resp 17   Ht 1.829 m (6' 0.01")   Wt 110 kg (241 lb 12.8 oz)   SpO2 95%   BMI 32.79 kg/m   Physical Exam  Constitutional:       Appearance: Normal appearance.   Cardiovascular:      Rate and Rhythm: Normal rate and regular rhythm.      Heart sounds: No murmur heard.  Pulmonary:      Effort: Pulmonary effort is normal.      Breath sounds: Normal breath sounds. No wheezing.   Musculoskeletal:         General: No swelling.   Skin:     General: Skin is warm and dry.   Neurological:      General: No focal deficit present.      Mental Status: He is alert and oriented to person, place, and time.    Psychiatric:         Mood and Affect: Mood normal.         Behavior: Behavior normal.          Data Review:  Pertinent laboratory data and imaging studies reviewed.      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  (primary encounter diagnosis)  Plan: Refer to [facility] ENT[facility]All[facility], Levocetirizine        (XYZAL) 5 mg Oral Tablet  - will add on oral antihistamine  - refer to ENT    (B07.9) Viral warts, unspecified type  Plan: imiquimod (ALDARA) 5 % Cream in Packet  - I'm working if this is actually HPV  - will need to do an exam in the future, may need long term cycling cryo    (N52.9) Erectile dysfunction, unspecified erectile dysfunction type  Plan: tadalafil (CIALIS) 10 mg Oral Tablet  - ED 2/2 hypogonadism from exogenous T  - currently taking non-reuglated Cialis, will prescribe      Return in about 3 months (around 12/18/2022).    Orders Placed This Encounter    Refer to [facility] ENT/Allergy Clinic    Levocetirizine (XYZAL) 5 mg Oral Tablet    imiquimod (ALDARA) 5 % Cream in Packet    tadalafil (CIALIS) 10 mg Oral Tablet          This is an established Patient. Pt has been seen in last 3 years.   [name], MD  09/18/2022 12:01

## 2022-09-22 ENCOUNTER — Other Ambulatory Visit (RURAL_HEALTH_CENTER): Payer: Self-pay | Admitting: Student in an Organized Health Care Education/Training Program

## 2022-09-22 DIAGNOSIS — J320 Chronic maxillary sinusitis: Secondary | ICD-10-CM

## 2022-09-22 MED ORDER — FLUTICASONE PROPIONATE 50 MCG/ACTUATION NASAL SPRAY,SUSPENSION
1.0000 | Freq: Every day | NASAL | 0 refills | Status: DC
Start: 2022-09-22 — End: 2022-09-25

## 2022-09-23 ENCOUNTER — Other Ambulatory Visit (RURAL_HEALTH_CENTER): Payer: Self-pay | Admitting: Student in an Organized Health Care Education/Training Program

## 2022-09-23 DIAGNOSIS — R0982 Postnasal drip: Secondary | ICD-10-CM

## 2022-09-23 DIAGNOSIS — H65492 Other chronic nonsuppurative otitis media, left ear: Secondary | ICD-10-CM

## 2022-09-25 ENCOUNTER — Other Ambulatory Visit (RURAL_HEALTH_CENTER): Payer: Self-pay | Admitting: Student in an Organized Health Care Education/Training Program

## 2022-09-25 DIAGNOSIS — J320 Chronic maxillary sinusitis: Secondary | ICD-10-CM

## 2022-09-25 MED ORDER — FLUTICASONE PROPIONATE 50 MCG/ACTUATION NASAL SPRAY,SUSPENSION
1.0000 | Freq: Every day | NASAL | 0 refills | Status: AC
Start: 2022-09-25 — End: ?

## 2022-10-06 ENCOUNTER — Encounter (RURAL_HEALTH_CENTER): Payer: Self-pay | Admitting: INTERNAL MEDICINE-ENDOCRINOLOGY-DIABETES AND METABOLISM

## 2022-10-06 ENCOUNTER — Ambulatory Visit (RURAL_HEALTH_CENTER): Payer: Self-pay | Admitting: INTERNAL MEDICINE-ENDOCRINOLOGY-DIABETES AND METABOLISM

## 2022-10-06 NOTE — Nursing Note (Signed)
Patient was referred by Dr [name] for pituitary adenoma-testosterone deficiency in male.  The new patient appointment he no showed.  Tried calling patient no answer.  I couldn't leave a message because of no voice mail.

## 2022-10-15 ENCOUNTER — Telehealth (HOSPITAL_BASED_OUTPATIENT_CLINIC_OR_DEPARTMENT_OTHER): Payer: Self-pay | Admitting: OTOLARYNGOLOGY

## 2022-10-15 NOTE — Telephone Encounter (Signed)
No vm, called regarding referral

## 2022-11-21 ENCOUNTER — Ambulatory Visit (INDEPENDENT_AMBULATORY_CARE_PROVIDER_SITE_OTHER): Payer: Self-pay | Admitting: Medical

## 2022-12-18 ENCOUNTER — Ambulatory Visit (RURAL_HEALTH_CENTER): Payer: Self-pay | Admitting: Student in an Organized Health Care Education/Training Program

## 2023-02-04 ENCOUNTER — Telehealth (HOSPITAL_BASED_OUTPATIENT_CLINIC_OR_DEPARTMENT_OTHER): Payer: Self-pay | Admitting: OTOLARYNGOLOGY

## 2023-02-04 NOTE — Telephone Encounter (Signed)
Tried to call pt but no vm set up, sent cancellation letter for 10/1 appt

## 2023-02-19 ENCOUNTER — Telehealth (HOSPITAL_BASED_OUTPATIENT_CLINIC_OR_DEPARTMENT_OTHER): Payer: Self-pay | Admitting: OTOLARYNGOLOGY

## 2023-02-19 NOTE — Telephone Encounter (Signed)
Spoke to pt, he doesn't need to be seen anymore

## 2023-02-24 ENCOUNTER — Ambulatory Visit (HOSPITAL_BASED_OUTPATIENT_CLINIC_OR_DEPARTMENT_OTHER): Payer: Self-pay | Admitting: OTOLARYNGOLOGY

## 2023-05-14 ENCOUNTER — Ambulatory Visit
Payer: No Typology Code available for payment source | Attending: Student in an Organized Health Care Education/Training Program | Admitting: Student in an Organized Health Care Education/Training Program

## 2023-05-14 ENCOUNTER — Other Ambulatory Visit: Payer: Self-pay

## 2023-05-14 ENCOUNTER — Encounter (RURAL_HEALTH_CENTER): Payer: Self-pay | Admitting: Student in an Organized Health Care Education/Training Program

## 2023-05-14 VITALS — BP 132/70 | HR 75 | Temp 98.2°F | Resp 17 | Ht 72.01 in | Wt 239.6 lb

## 2023-05-14 DIAGNOSIS — M25511 Pain in right shoulder: Secondary | ICD-10-CM | POA: Insufficient documentation

## 2023-05-14 DIAGNOSIS — M75101 Unspecified rotator cuff tear or rupture of right shoulder, not specified as traumatic: Secondary | ICD-10-CM | POA: Insufficient documentation

## 2023-05-14 NOTE — Progress Notes (Signed)
[place] [facility]  FAMILY MEDICINE, [facility]  [address] [place]  [place] [address]  [phone]   Progress Note    Date of Service:  05/15/2023  Savian, Habetz, 32 y.o. male  Date of Birth:  01/22/1991  PCP: [name], MD    Chief Complaint:  Shoulder Pain ( / 1:45 PM Return Patient Visit//Appointment Note//rt shoulder pain (injury ) not worker's comp ts//)       HPI:  Benjamin Mcmahon is a 32 y.o. White male  with a PMH of HTN, pituitary microadenoma, hypogonadism, . They are seen in the [facility] for follow up.     Medications reconciled and current.     Right shoulder pain:  - end of August was doing an arm wrestling competition. Felt an acute   "Ripping" during the match  - since then has had continued pain  - pain is worse when he is laying on his shoulder. 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    Past Medical History:   Diagnosis Date    Anxiety     HTN (hypertension)       Past Surgical History:   Procedure Laterality Date    HX TONSILLECTOMY      HX WISDOM TEETH EXTRACTION        Social History     Tobacco Use    Smoking status: Former     Types: Cigarettes     Passive exposure: Past    Smokeless tobacco: Current     Types: Snuff   Vaping Use    Vaping status: Every Day    Substances: Nicotine, Flavoring    Devices: Refillable tank   Substance Use Topics    Alcohol use: Yes     Comment: weekends     Drug use: Yes     Types: Marijuana, Anabolic Steroids     Comment: TRT       Family Medical History:    None        Outpatient Medications Marked as Taking for the 05/14/23 encounter (Office Visit) with [name], MD   Medication Sig    fluticasone propionate (FLONASE) 50 mcg/actuation Nasal Spray, Suspension Administer 1 Spray into each nostril Once a day    Levocetirizine (XYZAL) 5 mg Oral Tablet  Take 1 Tablet (5 mg total) by mouth Every evening Indications: inflammation of the nose due to an allergy    testosterone enanthate (DELATESTRYL) 200 mg/mL IntraMUSCULAR Oil Inject 0.25 mL (50 mg total) into the muscle Every 7 days      No Known Allergies     Review of Systems:  Positive findings addressed in HPI      PHYSICAL EXAM:   Vitals: BP 132/70   Pulse 75   Temp 36.8 C (98.2 F) (Temporal)   Resp 17   Ht 1.829 m (6' 0.01")   Wt 109 kg (239 lb 9.6 oz)   SpO2 96%   BMI 32.49 kg/m   Physical Exam  Musculoskeletal:      Comments: Pain with R shoulder abduction and external rotation in the abducted position.  Normal active range of motion, however with pain  Normal passive range of motion         Data Review:  Pertinent laboratory data and imaging  studies reviewed.      Assessment & Plan  Right shoulder pain  - patient maintains his active range of motion, despite having pain.  - suspect rotator cuff injury, partial tear  - strongly recommend physical therapy however patient is not agreeable at this time.  - he requests MRI order.  He is going to take this [facility] to [facility] to get this done via private payment    He actually has a appointment already scheduled with ortho next month    Orders:    * MRI SHOULDER RIGHT WO CONTRAST; Future    Right rotator cuff tear    Orders:    * MRI SHOULDER RIGHT WO CONTRAST; Future            Return in about 1 month (around 06/14/2023).    Orders Placed This Encounter    * MRI SHOULDER RIGHT WO CONTRAST          This is an established Patient. Pt has been seen in last 3 years.   [name], MD  05/15/2023 09:11

## 2023-05-26 ENCOUNTER — Ambulatory Visit (HOSPITAL_COMMUNITY)
Admission: RE | Admit: 2023-05-26 | Discharge: 2023-05-26 | Disposition: A | Discharge status: Home / Self Care | Payer: Self-pay | Source: Ambulatory Visit

## 2023-05-26 IMAGING — MR MRI SHOULDER RT W/O CONTRAST
6 series · 39 of 40 positions shown · IV contrast (gadolinium)
Comparison: None available.

﻿EXAM:  [id number]   MRI SHOULDER RT W/O CONTRAST
INDICATION: Persistent right shoulder pain and diminished range of motion.
TECHNIQUE: Multiplanar, multisequential MRI of the right shoulder was performed without gadolinium contrast.

[Series 9: T1 · oblique · right · 5.0mm · 0.42mm/px · 7 of 20 slices shown]
[im 1/20]
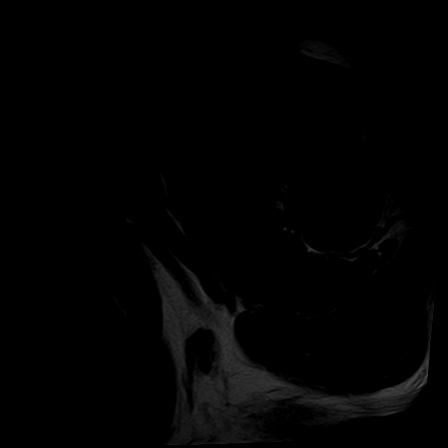
[im 4/20]
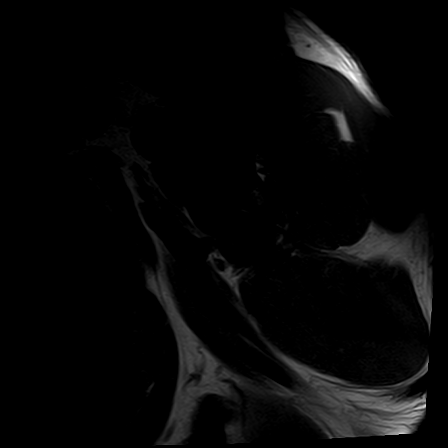
[im 7/20]
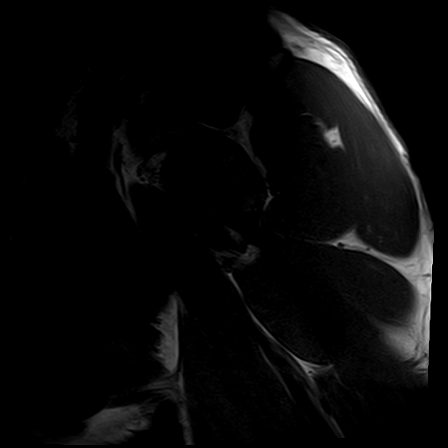
[im 10/20]
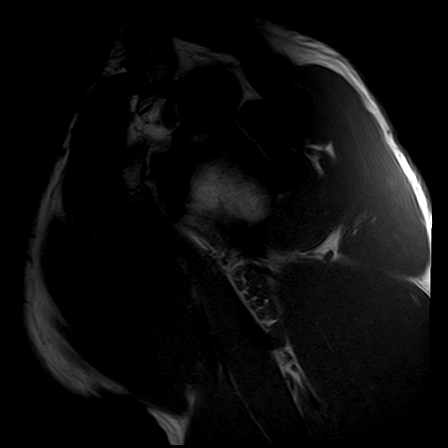
[im 13/20]
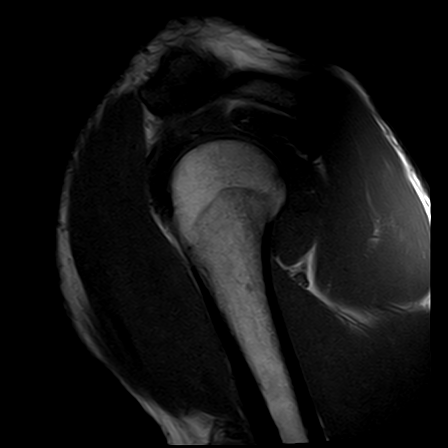
[im 16/20]
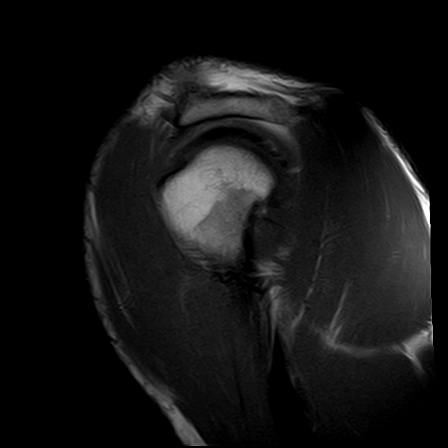
[im 20/20]
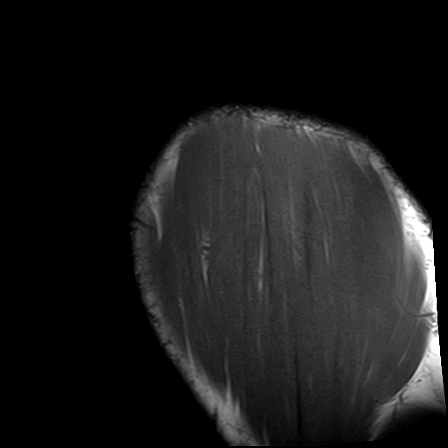

[Series 10: STIR · oblique · right · 5.0mm · 0.62mm/px · 7 of 20 slices shown (1 of 2)]
[im 1/20]
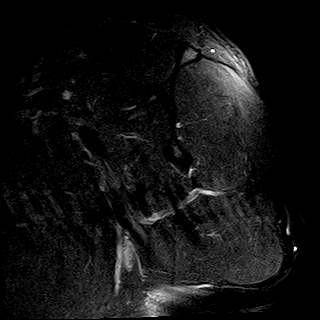
[im 4/20]
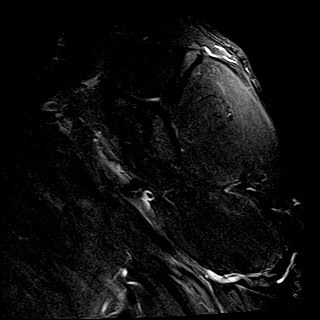
[im 7/20]
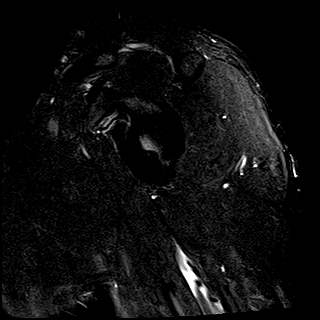
[im 10/20]
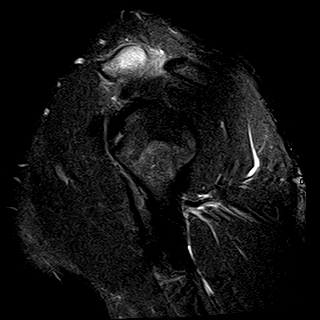
[im 13/20]
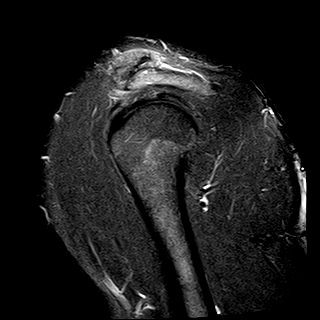
[im 16/20]
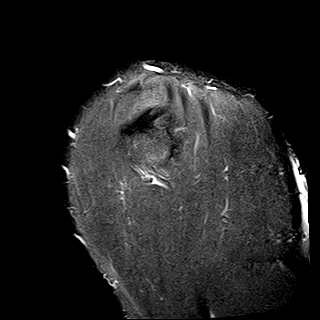
[im 20/20]
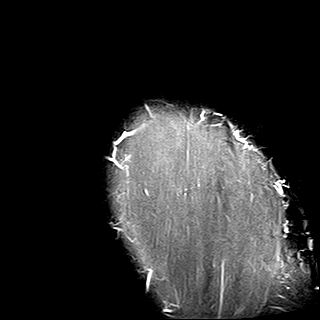

[Series 11: PD fat-sat · axial · right · 6.0mm · 0.59mm/px · z∈[-10,+105]mm · 6 of 20 slices shown (1 of 2)]
[im 1/20]
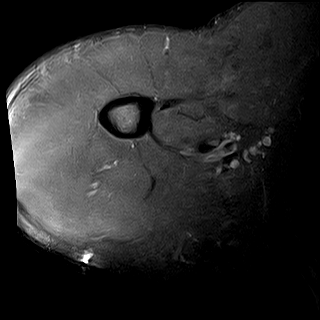
[im 4/20]
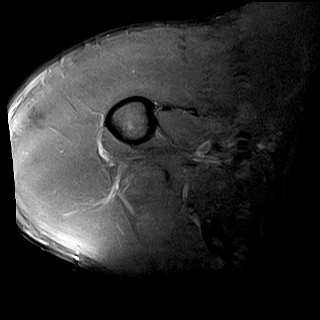
[im 8/20]
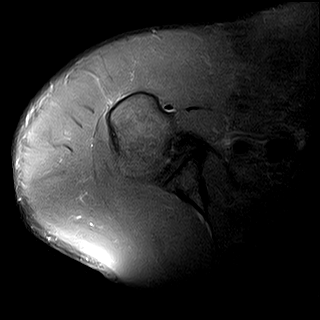
[im 12/20]
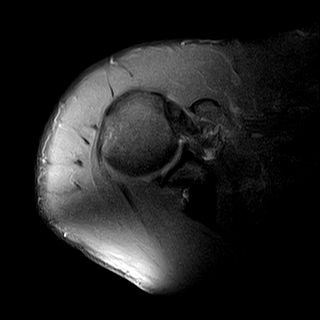
[im 16/20]
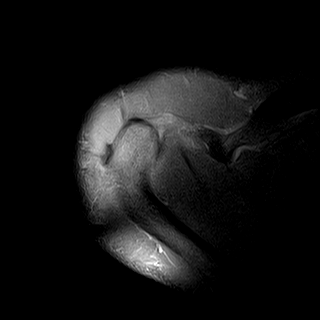
[im 20/20]
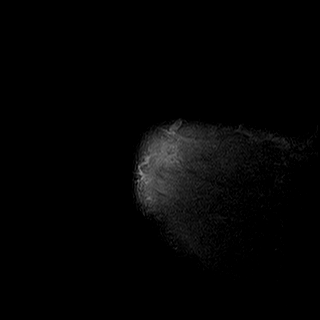

[Series 12: T2 fat-sat · axial · right · 4.0mm · 0.42mm/px · z∈[-20,+112]mm · 8 of 24 slices shown]
[im 1/24]
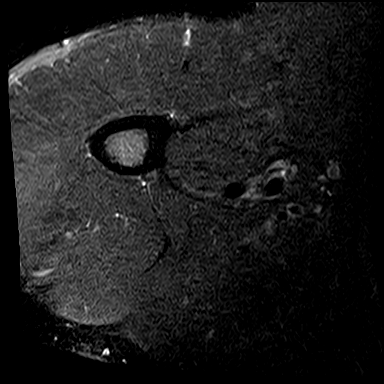
[im 4/24]
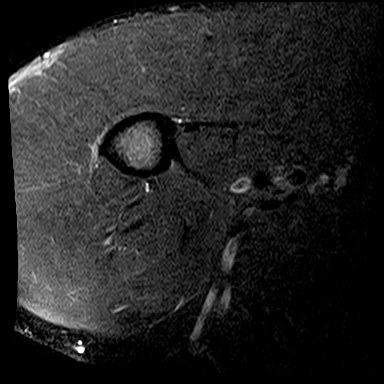
[im 7/24]
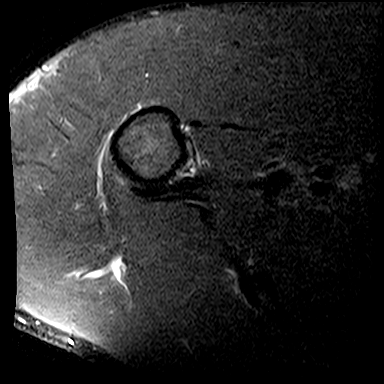
[im 10/24]
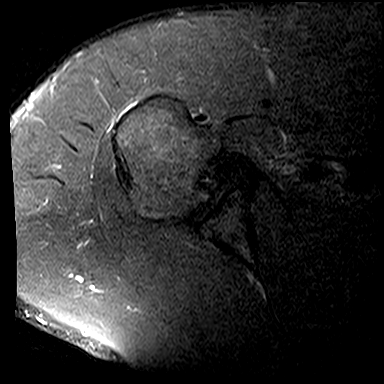
[im 14/24]
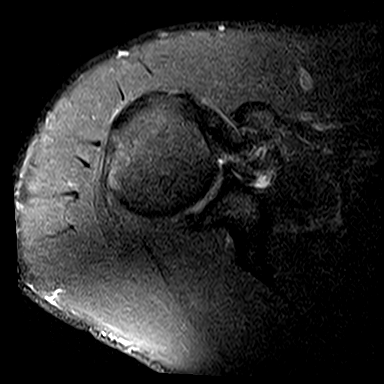
[im 17/24]
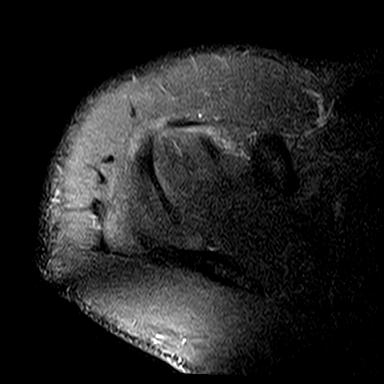
[im 20/24]
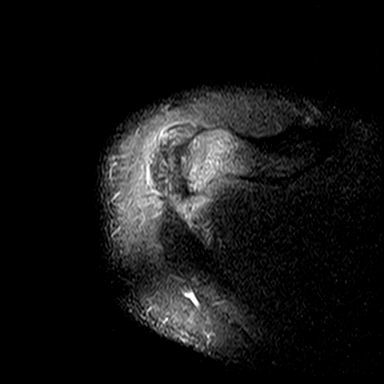
[im 24/24]
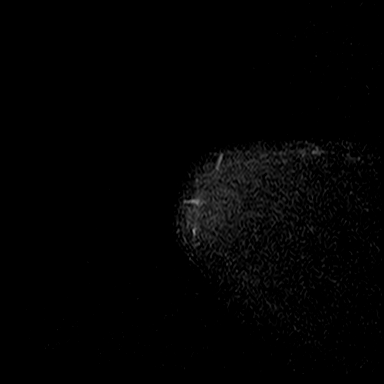

[Series 13: PD fat-sat · coronal · right · 5.0mm · 0.62mm/px · 6 of 20 slices shown (2 of 2)]
[im 1/20]
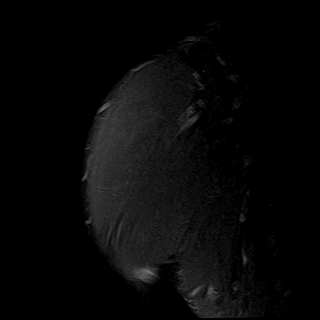
[im 4/20]
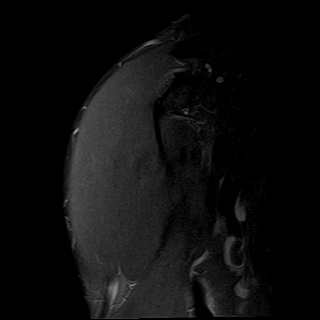
[im 8/20]
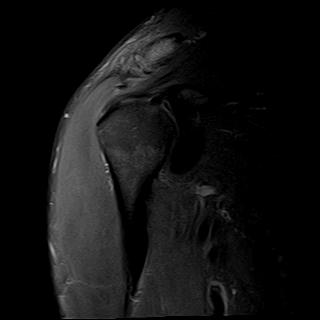
[im 12/20]
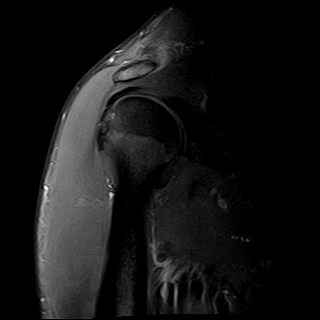
[im 16/20]
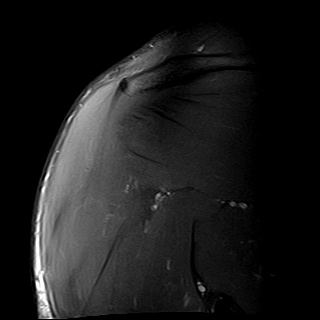
[im 20/20]
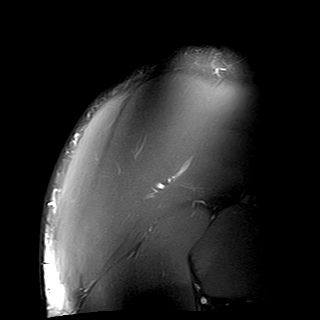

[Series 14: STIR · coronal · right · 5.0mm · 0.62mm/px · 5 of 20 slices shown (2 of 2)]
[im 1/20]
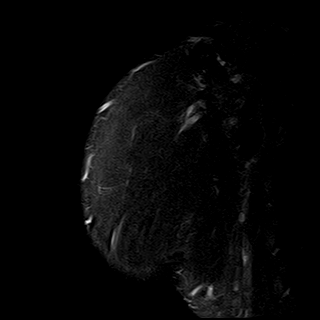
[im 4/20]
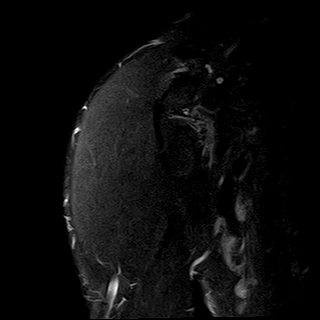
[im 8/20]
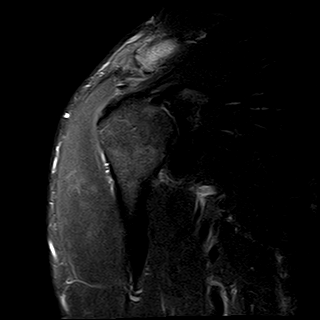
[im 12/20]
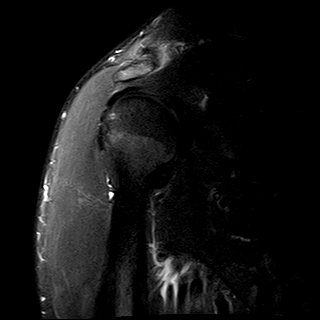
[im 16/20]
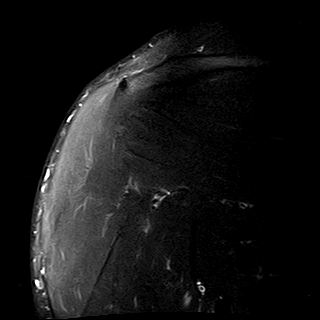

[39 of 40 positions shown; findings below may reference images not displayed]

FINDINGS: No acute bony lesions are seen at the right shoulder.  Degenerative changes and hook shaped acromion process are mildly impinging on the subacromion space and the supraspinatus.  Evidence of mild supraspinatus tendinitis is noted. No evidence of rotator cuff tear. 

Glenoid labrum shows no acute findings. Long head of biceps tendon is intact.
IMPRESSION: 1. Mild supraspinatus tendinitis.  Hook shaped acromion process is mildly impinging on supraspinatus tendon.  No evidence of rotator cuff tear. 

2. No acute bony lesions.  No acute findings of the labrum. Biceps tendon is intact.

## 2023-05-28 ENCOUNTER — Other Ambulatory Visit (HOSPITAL_COMMUNITY): Payer: Self-pay

## 2023-05-29 ENCOUNTER — Other Ambulatory Visit (HOSPITAL_COMMUNITY): Payer: Self-pay | Admitting: Student in an Organized Health Care Education/Training Program

## 2023-05-29 DIAGNOSIS — M25511 Pain in right shoulder: Secondary | ICD-10-CM

## 2023-06-11 ENCOUNTER — Encounter (HOSPITAL_COMMUNITY): Payer: Self-pay | Admitting: Student in an Organized Health Care Education/Training Program

## 2023-06-11 ENCOUNTER — Other Ambulatory Visit: Payer: Self-pay

## 2023-06-11 ENCOUNTER — Ambulatory Visit
Payer: No Typology Code available for payment source | Attending: Student in an Organized Health Care Education/Training Program | Admitting: Student in an Organized Health Care Education/Training Program

## 2023-06-11 ENCOUNTER — Ambulatory Visit (INDEPENDENT_AMBULATORY_CARE_PROVIDER_SITE_OTHER)
Admission: RE | Admit: 2023-06-11 | Discharge: 2023-06-11 | Disposition: A | Discharge status: Home / Self Care | Payer: No Typology Code available for payment source | Source: Ambulatory Visit | Attending: Student in an Organized Health Care Education/Training Program

## 2023-06-11 DIAGNOSIS — M25511 Pain in right shoulder: Secondary | ICD-10-CM

## 2023-06-11 DIAGNOSIS — M19011 Primary osteoarthritis, right shoulder: Secondary | ICD-10-CM | POA: Insufficient documentation

## 2023-06-11 DIAGNOSIS — M25811 Other specified joint disorders, right shoulder: Secondary | ICD-10-CM | POA: Insufficient documentation

## 2023-06-11 MED ORDER — TRIAMCINOLONE ACETONIDE 40 MG/ML SUSPENSION FOR INJECTION
80.0000 mg | INTRAMUSCULAR | Status: AC
Start: 2023-06-11 — End: 2023-06-11
  Administered 2023-06-11: 80 mg via INTRA_ARTICULAR

## 2023-06-11 MED ORDER — CELECOXIB 200 MG CAPSULE
200.0000 mg | ORAL_CAPSULE | Freq: Two times a day (BID) | ORAL | 0 refills | Status: AC
Start: 2023-06-11 — End: 2023-09-09

## 2023-06-11 MED ORDER — LIDOCAINE (PF) 10 MG/ML (1 %) INJECTION SOLUTION
4.0000 mL | Freq: Once | INTRAMUSCULAR | Status: AC
Start: 2023-06-11 — End: 2023-06-11
  Administered 2023-06-11: 4 mL via INTRAMUSCULAR

## 2023-06-11 NOTE — Procedures (Signed)
ORTHOPEDICS, [facility]  [address]  [place] [address]  Operated by [facility]  Procedure Note    Name: Dong Ulland MRN:  [id number]   Date: 06/11/2023 DOB:  12/05/1990 (33 y.o.)         Med Joint Arthrocentesis: R acromioclavicular  Indications: pain  Details: 21 G needle, superior approach  Outcome: tolerated well, no immediate complications        Procedure Note Cortisone Injection to Shoulder  The Right shoulder was identified as the injection site. The risk and benefits of a cortisone injection were explained and the patient consented to the injection. Under sterile conditions, the right  shoulder superiorly at the [facility] joint was injected with a mixture of 80mg  of Kenelog and 4 mL of 1% Lidocaine without complication. A sterile bandage was applied.    Howie Ill, DO

## 2023-06-11 NOTE — Progress Notes (Signed)
ORTHOPEDICS, [facility]  [address]  [place] [address]  Operated by [facility]     Name: Benjamin Mcmahon MRN:  [id number]   Date: 06/11/2023 Age: 33 y.o.        CHIEF COMPLAINT: New Patient (Right shoulder pain )        HISTORY OF PRESENT ILLNESS:    The patient is a 33 y.o. male who presents with right shoulder pain that has been present for the past year so.  He states that he does not recall any specific injury he is into competitive arm wrestling.  He states that he did have 1 turned him in where somebody jerked his arm and he has had a little bit of pain since that time he states the pain is worse with overhead motion and cross-body motion.  He complains of pain mostly at the [facility] joint but some into the subdeltoid region.  He will take naproxen as needed for the pain.  Continues to do a lot of body building but most overhead activity seem to bother him more..        Past Medical History:    Past Medical History:   Diagnosis Date    Anxiety     HTN (hypertension)       Past Surgical History:    Past Surgical History:   Procedure Laterality Date    HX TONSILLECTOMY      HX WISDOM TEETH EXTRACTION        Current Medications:   Current Outpatient Medications   Medication Sig    celecoxib (CELEBREX) 200 mg Oral Capsule Take 1 Capsule (200 mg total) by mouth Twice daily for 90 days    fluticasone propionate (FLONASE) 50 mcg/actuation Nasal Spray, Suspension Administer 1 Spray into each nostril Once a day    Levocetirizine (XYZAL) 5 mg Oral Tablet Take 1 Tablet (5 mg total) by mouth Every evening Indications: inflammation of the nose due to an allergy    testosterone enanthate (DELATESTRYL) 200 mg/mL IntraMUSCULAR Oil Inject 0.25 mL (50 mg total) into the muscle Every 7 days      Allergies  No Known Allergies   Social history  Social History     Socioeconomic History    Marital status: Married     Spouse name: Not on file    Number of children: 0     Years of education: Not on file    Highest education level: Not on file   Occupational History    Not on file   Tobacco Use    Smoking status: Former     Types: Cigarettes     Passive exposure: Past    Smokeless tobacco: Current     Types: Snuff   Vaping Use    Vaping status: Every Day    Substances: Nicotine, Flavoring    Devices: Refillable tank   Substance and Sexual Activity    Alcohol use: Yes     Comment: weekends     Drug use: Yes     Types: Marijuana, Anabolic Steroids     Comment: TRT    Sexual activity: Yes     Partners: Female   Other Topics Concern    Not on file   Social History Narrative    Right handed      Social Determinants of Health     Financial Resource Strain: Low Risk  (08/12/2022)    Financial Resource Strain     SDOH Financial:  No   Transportation Needs: Low Risk  (08/12/2022)    Transportation Needs     SDOH Transportation: No   Social Connections: Low Risk  (08/12/2022)    Social Connections     SDOH Social Isolation: 5 or more times a week   Intimate Partner Violence: Low Risk  (08/12/2022)    Intimate Partner Violence     SDOH Domestic Violence: No   Housing Stability: Low Risk  (08/12/2022)    Housing Stability     SDOH Housing Situation: I have housing.     SDOH Housing Worry: No      Family History:   Family Medical History:    None             REVIEW OF SYSTEMS as discussed in HPI     PHYSICAL EXAM:    VITALS:  There were no vitals taken for this visit.      Constitutional: Oriented to person, place, and time; Answer questions appropriately  HENT: Head: Normocephalic and atraumatic.   Eyes: EOMI, PEARL  Neck: Supple, trachea midline  Cardiovascular: Brisk capillary refill to all extremities, extremities well perfused  Pulmonary/Chest: No increased work of breathing, no cough  Abdominal: Non-tender, non-distended  Neurologic:  Awake, alert and oriented in three planes. No gross deficits   Musculoskeletal:  Right upper extremity    His skin is intact circumferentially no erythema or warmth  he is tender to palpation over the [facility] joint he has pain with cross-body motion of the arm at the [facility] joint.  He has positive Neer impingement sign.  His compartments are all soft and compressible.  He has 5/5 strength with supraspinatus infraspinatus teres minor and subscapularis testing.  He is neurovascularly intact distally he has a negative Spurling compression test.       DATA:    CBC:   Lab Results   Component Value Date    WBC 6.5 08/19/2022    RBC 5.54 08/19/2022    HGB 16.8 08/19/2022    HCT 48.9 08/19/2022    MCV 88.3 08/19/2022    MCH 30.3 08/19/2022    MCHC 34.4 08/19/2022    MPV 10.9 08/19/2022     Radiology Review:   X-ray of the right shoulder was obtained and independently reviewed by myself from 06/10/2022 demonstrating no fractures or dislocations he has some mild AC arthritis with joint space narrowing and some osteolysis of the distal clavicle.  The glenohumeral joint is well-maintained no significant osteoarthritis no superior migration of the humeral head.    MRI of the right shoulder was reviewed from an outside hospital from 05/26/2023 demonstrating no tear in the rotator cuff tendons.  Labrum is intact.  There is some inflammation present to the supraspinatus.  There is AC arthritis with cyst present to the distal clavicle and acromion.    Impression:  AC arthritis and rotator cuff tendonitis     IMPRESSION:    ICD-10-CM    1. Arthralgia of right acromioclavicular joint  M25.511 Refer to [facility]      2. Impingement of right shoulder  M25.811 Refer to [facility]              PLAN:  1. I discussed with him his diagnosis he does have AC arthritis and rotator cuff tendonitis.  We discussed treatment options for this including surgical versus nonsurgical management.  I discussed we could try a 1 time steroid injection into the [facility] joint and subacromial space to  see if this relieves his symptoms.  He was in agreement and wished to proceed with the injection.  We  also recommended formal physical therapy to the right shoulder work on rotator cuff strengthening which I think will help his pain.  We briefly discuss surgical options for this which would be arthroscopic rotator cuff debridement subacromial decompression and Mumford procedure.  We will send in some Celebrex to see if this relieves his symptoms as well.  If he is not doing any better in 6 weeks we could discuss surgical options for this    Referral note and/or ER note was reviewed in detail    Orders Placed This Encounter    Refer to [facility]    celecoxib (CELEBREX) 200 mg Oral Capsule    lidocaine PF (XYLOCAINE-MPF) 1% injection    triamcinolone acetonide (KENALOG-40) 40 mg/mL injection      Return in about 6 weeks (around 07/23/2023).     Howie Ill, DO  06/11/2023 09:35

## 2023-07-24 ENCOUNTER — Other Ambulatory Visit: Payer: Self-pay

## 2023-07-24 ENCOUNTER — Ambulatory Visit
Payer: Self-pay | Attending: Student in an Organized Health Care Education/Training Program | Admitting: Student in an Organized Health Care Education/Training Program

## 2023-07-24 VITALS — Ht 72.0 in | Wt 239.0 lb

## 2023-07-24 DIAGNOSIS — Z01818 Encounter for other preprocedural examination: Secondary | ICD-10-CM | POA: Insufficient documentation

## 2023-07-24 DIAGNOSIS — M7541 Impingement syndrome of right shoulder: Secondary | ICD-10-CM | POA: Insufficient documentation

## 2023-07-24 DIAGNOSIS — T148XXA Other injury of unspecified body region, initial encounter: Secondary | ICD-10-CM | POA: Insufficient documentation

## 2023-07-24 DIAGNOSIS — R739 Hyperglycemia, unspecified: Secondary | ICD-10-CM | POA: Insufficient documentation

## 2023-07-24 DIAGNOSIS — M19011 Primary osteoarthritis, right shoulder: Secondary | ICD-10-CM | POA: Insufficient documentation

## 2023-07-24 DIAGNOSIS — M25811 Other specified joint disorders, right shoulder: Secondary | ICD-10-CM | POA: Insufficient documentation

## 2023-07-24 NOTE — Progress Notes (Signed)
 ORTHOPEDICS, [facility]  [address]  [place] [address]  Operated by [facility]     Name: Benjamin Mcmahon MRN:  [id number]   Date: 07/24/2023 Age: 33 y.o.        CHIEF COMPLAINT: Re-eval (6 wk f/up impingement syndrome of right shoulder )        HISTORY OF PRESENT ILLNESS:    The patient is a 33 y.o. male who presents with right shoulder pain that has been present for the past year so.  He states that he does not recall any specific injury he is into competitive arm wrestling.  He states that he did have 1 turned him in where somebody jerked his arm and he has had a little bit of pain since that time he states the pain is worse with overhead motion and cross-body motion.  He complains of pain mostly at the [facility] joint but some into the subdeltoid region.  He will take naproxen as needed for the pain.  Continues to do a lot of body building but most overhead activity seem to bother him more.  He states the steroid helped for about 4 weeks and then wore off.  He states that is worse with overhead motion better with rest he states the Celebrex is not helping his pain enough.  He is interested in pursuing surgical options for this.       Past Medical History:    Past Medical History:   Diagnosis Date    Anxiety     HTN (hypertension)       Past Surgical History:    Past Surgical History:   Procedure Laterality Date    HX TONSILLECTOMY      HX WISDOM TEETH EXTRACTION        Current Medications:   Current Outpatient Medications   Medication Sig    celecoxib (CELEBREX) 200 mg Oral Capsule Take 1 Capsule (200 mg total) by mouth Twice daily for 90 days    fluticasone propionate (FLONASE) 50 mcg/actuation Nasal Spray, Suspension Administer 1 Spray into each nostril Once a day    Levocetirizine (XYZAL) 5 mg Oral Tablet Take 1 Tablet (5 mg total) by mouth Every evening Indications: inflammation of the nose due to an allergy    testosterone enanthate (DELATESTRYL)  200 mg/mL IntraMUSCULAR Oil Inject 0.25 mL (50 mg total) into the muscle Every 7 days      Allergies  No Known Allergies   Social history  Social History     Socioeconomic History    Marital status: Married     Spouse name: Not on file    Number of children: 0    Years of education: Not on file    Highest education level: Not on file   Occupational History    Not on file   Tobacco Use    Smoking status: Former     Types: Cigarettes     Passive exposure: Past    Smokeless tobacco: Current     Types: Snuff   Vaping Use    Vaping status: Every Day    Substances: Nicotine, Flavoring    Devices: Refillable tank   Substance and Sexual Activity    Alcohol use: Yes     Comment: weekends     Drug use: Yes     Types: Marijuana, Anabolic Steroids     Comment: TRT    Sexual activity: Yes     Partners: Female   Other Topics  Concern    Not on file   Social History Narrative    Right handed      Social Determinants of Health     Financial Resource Strain: Low Risk  (08/12/2022)    Financial Resource Strain     SDOH Financial: No   Transportation Needs: Low Risk  (08/12/2022)    Transportation Needs     SDOH Transportation: No   Social Connections: Low Risk  (08/12/2022)    Social Connections     SDOH Social Isolation: 5 or more times a week   Intimate Partner Violence: Low Risk  (08/12/2022)    Intimate Partner Violence     SDOH Domestic Violence: No   Housing Stability: Low Risk  (08/12/2022)    Housing Stability     SDOH Housing Situation: I have housing.     SDOH Housing Worry: No      Family History:   Family Medical History:    None             REVIEW OF SYSTEMS as discussed in HPI     PHYSICAL EXAM:    VITALS:  Ht 1.829 m (6')   Wt 108 kg (239 lb)   BMI 32.41 kg/m       Constitutional: Oriented to person, place, and time; Answer questions appropriately  HENT: Head: Normocephalic and atraumatic.   Eyes: EOMI, PEARL  Neck: Supple, trachea midline  Cardiovascular: Brisk capillary refill to all extremities, extremities well  perfused  Pulmonary/Chest: No increased work of breathing, no cough  Abdominal: Non-tender, non-distended  Neurologic:  Awake, alert and oriented in three planes. No gross deficits   Musculoskeletal:  Right upper extremity    His skin is intact circumferentially no erythema or warmth he is tender to palpation over the [facility] joint he has pain with cross-body motion of the arm at the [facility] joint.  He has positive Neer impingement sign.  His compartments are all soft and compressible.  He has 5/5 strength with supraspinatus infraspinatus teres minor and subscapularis testing.  He is neurovascularly intact distally he has a negative Spurling compression test.       DATA:    CBC:   Lab Results   Component Value Date    WBC 6.5 08/19/2022    RBC 5.54 08/19/2022    HGB 16.8 08/19/2022    HCT 48.9 08/19/2022    MCV 88.3 08/19/2022    MCH 30.3 08/19/2022    MCHC 34.4 08/19/2022    MPV 10.9 08/19/2022     Radiology Review:   X-ray of the right shoulder was obtained and independently reviewed by myself from 06/10/2022 demonstrating no fractures or dislocations he has some mild AC arthritis with joint space narrowing and some osteolysis of the distal clavicle.  The glenohumeral joint is well-maintained no significant osteoarthritis no superior migration of the humeral head.    MRI of the right shoulder was reviewed from an outside hospital from 05/26/2023 demonstrating no tear in the rotator cuff tendons.  Labrum is intact.  There is some inflammation present to the supraspinatus.  There is AC arthritis with cyst present to the distal clavicle and acromion.    Impression:  AC arthritis and rotator cuff tendonitis     IMPRESSION:    ICD-10-CM    1. Arthritis of right acromioclavicular joint  M19.011       2. Impingement of right shoulder  M25.811       3. Preop examination  Z01.818  4. Preop testing  Z01.818 CBC/DIFF     COMPREHENSIVE METABOLIC PANEL, NON-FASTING     CANCELED: ECG 12 Lead (SMR ONLY)     CANCELED: XR CHEST PA AND  LATERAL      5. Contusion  T14.8XXA       6. Elevated blood sugar  R73.9               PLAN:  1. He has failed conservative treatment for his AC arthritis including physical therapy, anti-inflammatories, and a steroid injection.  He does have significant AC arthritis I think this is the main cause of his problem but he also has a impingement syndrome.  I did recommend a right shoulder arthroscopy with rotator cuff debridement subacromial decompression and open Mumford procedure.  We discussed the risks and benefits of the surgery in detail including but not limited to neurovascular injury, wound complication, infection, continued pain, need for further surgery, deep vein thrombosis, PE, bleeding, tendon injury, stiffness, tendon injury, and risks of anesthesia including but not limited to death he understood and wished to proceed we will plan for right shoulder arthroscopy with limited debridement and open Mumford procedure.    Referral note and/or ER note was reviewed in detail    Orders Placed This Encounter    CANCELED: XR CHEST PA AND LATERAL    CBC/DIFF    COMPREHENSIVE METABOLIC PANEL, NON-FASTING    CANCELED: ECG 12 Lead (SMR ONLY)      No follow-ups on file.     Howie Ill, DO  07/24/2023 11:25

## 2023-07-24 NOTE — Patient Instructions (Signed)
 Procedure Date: September 09, 2023  A pre-op assessment must be done within one week of the date of your procedure.  Pre-Op Instructions     You may contact the pre-op nurse at [phone], they will call you 7 to          10 days prior to your procedure.          2.  Office number is [phone] for questions and or to reschedule.          The Surgery Department will contact you the day before your procedure between 1PM and 3PM for your arrival time.  If you are scheduled on a Tuesday, you will be contacted the Monday afternoon prior to your procedure. If you are scheduled on a Friday, you will be contacted the Thursday afternoon prior to your procedure.     Procedure Instructions:      Please follow your surgeon's instructions concerning your blood thinners (such as Aspirin, Plavix, Coumadin, Xarelto, Eliquis).     DO NOT EAT or DRINK AFTER MIDNIGHT THE DAY OF YOUR PROCEDURE, UNLESS OTHERWISE INSTRUCTED.  This includes gum, mints, and candy.  You must also refrain from alcohol, recreational drugs, and smoking or chewing tobacco until after your procedure.  YOUR PROCEDURE WILL BE CANCELED IF THESE INSTRUCTIONS ARE NOT FOLLOWED.     You must make arrangements for someone to drive you home and they must remain at the hospital during your procedure.  If you use public transportation (taxi, bus, etc.), a responsible person must accompany you.         CHG (Chlorhexidine Gluconate) Body Wash Before Surgery  Chlorhexidine gluconate (CHG) is a cleaning product that kills germs. Before your scheduled surgery, your surgical team may advise you to wash your body using a CHG skin cleanser. This helps reduce your risk for a surgical site infection.   CHG gets rid of many of the germs on your skin. It helps keep your surgical site clean and lowers the chance of infection. Washing with CHG is better at preventing infections than washing with plain soap and water.   How CHG washing is done  CHG comes in different forms, including  disposable cloths and liquid soap. Below are some general tips for washing your body with CHG. Follow the directions for the specific CHG product your surgical team has advised you to use.   Follow any directions from your surgical team about how often and for how many days before your surgery you need to wash with CHG.   For CHG cloths. Follow the specific instructions on the package. In general:   Before using a CHG cloth, first take a normal bath or shower using your regular soap and shampoo.  Clean your body with CHG cloths from the neck down only.  Don't use CHG cloths on your face, hair, genital, or rectal area.  Don't shave any body part below the neck.  Use one cloth for each part of the body (chest, belly, arms, legs, back).  Wipe back and forth, but don't scrub your skin.  Let your skin air dry.  Don't use any makeup, powder, lotion, or deodorant on your skin.  For CHG liquid soap. Follow the specific instructions on the package. In general:   Before using CHG liquid soap, first take a normal bath or shower using your regular soap and shampoo. Completely rinse off.  Wash your body with CHG liquid soap from the neck down only.  Don't use CHG  liquid soap on your face, hair, genital, or rectal area.  Don't shave any body part below the neck.  Wash your body gently for 5 minutes.  Wash your surgical site thoroughly.  Rinse the CHG soap.  Let your skin air dry.  Don't use any makeup, powder, lotion, or deodorant on your skin.  Let your healthcare provider know right away if washing with CHG feels uncomfortable, or if you have any skin irritation. Some people may have an allergic reaction to CHG. If that happens, your provider will decide if you need to stop using CHG.   Air dry after using CHG  After washing with CHG, let your skin air dry. Don't use a towel to dry off. It's important not to wipe off the CHG after washing with it. Your skin may feel sticky for a few minutes, but don't rinse off with water. When  your skin is completely dry, the sticky feeling should go away.   Risks of CHG body wash  Washing with CHG is generally safe. Some possible side effects may include:  Skin rash (usually mild)  Skin dryness  Allergic reaction (in rare cases)  In very rare cases, a serious and sometimes life-threatening allergic reaction (anaphylactic reaction)  Your risks may differ depending on your age, your overall health, and other factors.   A CHG body wash might not be right for you if you have serious skin problems or conditions, skin irritation, broken or cut skin, or burns. Your healthcare provider will decide if washing with CHG is advised for you. Always talk with your provider about any concerns.   When to call your healthcare provider  Call your healthcare provider right away if:  Washing with CHG feels uncomfortable  You have skin irritation after washing with CHG  [employer] last reviewed this educational content on 03/26/2020   2000-2023 [employer], [place]. All rights reserved. This information is not intended as a substitute for professional medical care. Always follow your healthcare professional's instructions.

## 2023-08-31 ENCOUNTER — Encounter (HOSPITAL_COMMUNITY): Payer: Self-pay | Admitting: Student in an Organized Health Care Education/Training Program

## 2023-08-31 ENCOUNTER — Other Ambulatory Visit (HOSPITAL_COMMUNITY): Payer: Self-pay | Admitting: Student in an Organized Health Care Education/Training Program

## 2023-08-31 DIAGNOSIS — Z01818 Encounter for other preprocedural examination: Secondary | ICD-10-CM

## 2023-09-06 ENCOUNTER — Other Ambulatory Visit: Payer: Self-pay

## 2023-09-06 ENCOUNTER — Ambulatory Visit (HOSPITAL_COMMUNITY): Attending: Student in an Organized Health Care Education/Training Program

## 2023-09-06 ENCOUNTER — Ambulatory Visit (HOSPITAL_COMMUNITY)
Admission: RE | Admit: 2023-09-06 | Discharge: 2023-09-06 | Disposition: A | Discharge status: Home / Self Care | Source: Ambulatory Visit | Attending: Student in an Organized Health Care Education/Training Program | Admitting: Student in an Organized Health Care Education/Training Program

## 2023-09-06 DIAGNOSIS — Z01818 Encounter for other preprocedural examination: Secondary | ICD-10-CM

## 2023-09-06 LAB — COMPREHENSIVE METABOLIC PANEL, NON-FASTING
ALBUMIN: 4.3 g/dL (ref 3.5–5.0)
ALKALINE PHOSPHATASE: 70 U/L (ref 45–115)
ALT (SGPT): 28 U/L (ref <43)
ANION GAP: 9 mmol/L (ref 4–13)
AST (SGOT): 35 U/L — ABNORMAL HIGH (ref 11–34)
BILIRUBIN TOTAL: 0.7 mg/dL (ref 0.3–1.3)
BUN/CREA RATIO: 14 (ref 6–22)
BUN: 15 mg/dL (ref 8–25)
CALCIUM: 9.2 mg/dL (ref 8.6–10.2)
CHLORIDE: 108 mmol/L (ref 96–111)
CO2 TOTAL: 23 mmol/L (ref 22–30)
CREATININE: 1.06 mg/dL (ref 0.75–1.35)
ESTIMATED GFR - MALE: >90 mL/min/BSA (ref >=60)
GLUCOSE: 92 mg/dL (ref 65–125)
POTASSIUM: 4.1 mmol/L (ref 3.5–5.1)
PROTEIN TOTAL: 7.2 g/dL (ref 6.4–8.3)
SODIUM: 140 mmol/L (ref 136–145)

## 2023-09-06 LAB — CBC WITH DIFF
BASOPHIL #: <0.1 10*3/uL (ref <=0.20)
BASOPHIL %: 1.3 %
EOSINOPHIL #: 0.19 10*3/uL (ref <=0.50)
EOSINOPHIL %: 3.1 %
HCT: 50.8 % (ref 38.9–52.0)
HGB: 17.5 g/dL (ref 13.4–17.5)
IMMATURE GRANULOCYTE #: <0.1 10*3/uL (ref <0.10)
IMMATURE GRANULOCYTE %: 0.3 % (ref 0.0–1.0)
LYMPHOCYTE #: 1.08 10*3/uL (ref 1.00–4.80)
LYMPHOCYTE %: 17.6 %
MCH: 28.9 pg (ref 26.0–32.0)
MCHC: 34.4 g/dL (ref 31.0–35.5)
MCV: 84 fL (ref 78.0–100.0)
MONOCYTE #: 0.64 10*3/uL (ref 0.20–1.10)
MONOCYTE %: 10.4 %
MPV: 10.5 fL (ref 8.7–12.5)
NEUTROPHIL #: 4.13 10*3/uL (ref 1.50–7.70)
NEUTROPHIL %: 67.3 %
PLATELETS: 199 10*3/uL (ref 150–400)
RBC: 6.05 10*6/uL (ref 4.50–6.10)
RDW-CV: 14.3 % (ref 11.5–15.5)
WBC: 6.1 10*3/uL (ref 3.7–11.0)

## 2023-09-07 LAB — ECG 12-LEAD
Atrial Rate: 73 {beats}/min
Calculated P Axis: 48 degrees
Calculated R Axis: 86 degrees
Calculated T Axis: 37 degrees
PR Interval: 142 ms
QRS Duration: 98 ms
QT Interval: 380 ms
QTC Calculation: 418 ms
Ventricular rate: 73 {beats}/min

## 2023-09-09 ENCOUNTER — Ambulatory Visit
Admission: RE | Admit: 2023-09-09 | Discharge: 2023-09-09 | Disposition: A | Discharge status: Home / Self Care | Source: Ambulatory Visit | Attending: Student in an Organized Health Care Education/Training Program | Admitting: Student in an Organized Health Care Education/Training Program

## 2023-09-09 ENCOUNTER — Ambulatory Visit (HOSPITAL_COMMUNITY): Admitting: CERTIFIED REGISTERED NURSE ANESTHETIST-CRNA

## 2023-09-09 ENCOUNTER — Other Ambulatory Visit: Payer: Self-pay

## 2023-09-09 ENCOUNTER — Encounter (HOSPITAL_COMMUNITY): Payer: Self-pay | Admitting: Student in an Organized Health Care Education/Training Program

## 2023-09-09 ENCOUNTER — Encounter (HOSPITAL_COMMUNITY)
Admission: RE | Disposition: A | Discharge status: Home / Self Care | Payer: Self-pay | Source: Ambulatory Visit | Attending: Student in an Organized Health Care Education/Training Program

## 2023-09-09 DIAGNOSIS — M778 Other enthesopathies, not elsewhere classified: Secondary | ICD-10-CM | POA: Insufficient documentation

## 2023-09-09 DIAGNOSIS — M25811 Other specified joint disorders, right shoulder: Secondary | ICD-10-CM | POA: Insufficient documentation

## 2023-09-09 DIAGNOSIS — F419 Anxiety disorder, unspecified: Secondary | ICD-10-CM | POA: Insufficient documentation

## 2023-09-09 DIAGNOSIS — K219 Gastro-esophageal reflux disease without esophagitis: Secondary | ICD-10-CM | POA: Insufficient documentation

## 2023-09-09 DIAGNOSIS — I10 Essential (primary) hypertension: Secondary | ICD-10-CM | POA: Insufficient documentation

## 2023-09-09 DIAGNOSIS — M19011 Primary osteoarthritis, right shoulder: Secondary | ICD-10-CM | POA: Insufficient documentation

## 2023-09-09 DIAGNOSIS — F17290 Nicotine dependence, other tobacco product, uncomplicated: Secondary | ICD-10-CM | POA: Insufficient documentation

## 2023-09-09 DIAGNOSIS — G8918 Other acute postprocedural pain: Secondary | ICD-10-CM | POA: Insufficient documentation

## 2023-09-09 HISTORY — DX: Presence of spectacles and contact lenses: Z97.3

## 2023-09-09 SURGERY — ARTHROSCOPY SHOULDER WITH SUBACROMIAL DECOMPRESSION
Anesthesia: General | Site: Shoulder | Laterality: Right | Wound class: Clean Wound: Uninfected operative wounds in which no inflammation occurred

## 2023-09-09 MED ORDER — DEXMEDETOMIDINE 4 MCG/ML IV DILUTION
Freq: Once | INTRAMUSCULAR | Status: DC | PRN
Start: 2023-09-09 — End: 2023-09-09
  Administered 2023-09-09: 8 ug via INTRAVENOUS
  Administered 2023-09-09: 4 ug via INTRAVENOUS

## 2023-09-09 MED ORDER — MIDAZOLAM 1 MG/ML INJECTION WRAPPER
INTRAMUSCULAR | Status: AC
Start: 2023-09-09 — End: 2023-09-09
  Filled 2023-09-09: qty 2

## 2023-09-09 MED ORDER — DEXAMETHASONE SODIUM PHOSPHATE (PF) 10 MG/ML INJECTION SOLUTION
INTRAMUSCULAR | Status: AC
Start: 2023-09-09 — End: 2023-09-09
  Filled 2023-09-09: qty 1

## 2023-09-09 MED ORDER — CEFAZOLIN 2 GRAM INTRAVENOUS SOLUTION
INTRAVENOUS | Status: AC
Start: 2023-09-09 — End: 2023-09-09
  Filled 2023-09-09: qty 14.71

## 2023-09-09 MED ORDER — KETAMINE 10 MG/ML INJECTION WRAPPER
INTRAMUSCULAR | Status: AC
Start: 2023-09-09 — End: 2023-09-09
  Filled 2023-09-09: qty 5

## 2023-09-09 MED ORDER — SUGAMMADEX 100 MG/ML INTRAVENOUS SOLUTION
INTRAVENOUS | Status: AC
Start: 2023-09-09 — End: 2023-09-09
  Filled 2023-09-09: qty 2

## 2023-09-09 MED ORDER — MIDAZOLAM 1 MG/ML INJECTION WRAPPER
Freq: Once | INTRAMUSCULAR | Status: DC | PRN
Start: 2023-09-09 — End: 2023-09-09
  Administered 2023-09-09: 2 mg via INTRAVENOUS

## 2023-09-09 MED ORDER — SODIUM CHLORIDE 0.9 % INTRAVENOUS SOLUTION
INTRAVENOUS | Status: DC | PRN
Start: 2023-09-09 — End: 2023-09-09
  Administered 2023-09-09: 0 via INTRAVENOUS

## 2023-09-09 MED ORDER — SODIUM CHLORIDE 0.9 % IRRIGATION SOLUTION
Freq: Once | Status: AC
Start: 2023-09-09 — End: 2023-09-09
  Filled 2023-09-09: qty 1

## 2023-09-09 MED ORDER — SODIUM CHLORIDE 0.9 % (FLUSH) INJECTION SYRINGE
10.0000 mL | INJECTION | INTRAMUSCULAR | Status: DC | PRN
Start: 2023-09-09 — End: 2023-09-09

## 2023-09-09 MED ORDER — ONDANSETRON HCL (PF) 4 MG/2 ML INJECTION SOLUTION
INTRAMUSCULAR | Status: AC
Start: 2023-09-09 — End: 2023-09-09
  Filled 2023-09-09: qty 2

## 2023-09-09 MED ORDER — KETAMINE 10 MG/ML INJECTION WRAPPER
Freq: Once | INTRAMUSCULAR | Status: DC | PRN
Start: 2023-09-09 — End: 2023-09-09
  Administered 2023-09-09: 30 mg via INTRAVENOUS
  Administered 2023-09-09: 20 mg via INTRAVENOUS

## 2023-09-09 MED ORDER — DEXAMETHASONE SODIUM PHOSPHATE (PF) 10 MG/ML INJECTION SOLUTION
Freq: Once | INTRAMUSCULAR | Status: AC | PRN
Start: 2023-09-09 — End: 2023-09-09
  Administered 2023-09-09: 5 mg via INTRAMUSCULAR

## 2023-09-09 MED ORDER — PROPOFOL 10 MG/ML IV BOLUS
INJECTION | Freq: Once | INTRAVENOUS | Status: DC | PRN
Start: 2023-09-09 — End: 2023-09-09
  Administered 2023-09-09: 200 mg via INTRAVENOUS
  Administered 2023-09-09: 50 mg via INTRAVENOUS

## 2023-09-09 MED ORDER — BUPIVACAINE (PF) 0.25 % (2.5 MG/ML) INJECTION SOLUTION
20.0000 mL | Freq: Once | INTRAMUSCULAR | Status: AC
Start: 2023-09-09 — End: 2023-09-09
  Administered 2023-09-09: 20 mL
  Filled 2023-09-09: qty 20

## 2023-09-09 MED ORDER — LIDOCAINE (PF) 20 MG/ML (2 %) INJECTION SOLUTION
INTRAMUSCULAR | Status: AC
Start: 2023-09-09 — End: 2023-09-09
  Filled 2023-09-09: qty 5

## 2023-09-09 MED ORDER — DEXMEDETOMIDINE 200 MCG/50 ML (4 MCG/ML) IN 0.9 % SODIUM CHLORIDE IV
INTRAVENOUS | Status: AC
Start: 2023-09-09 — End: 2023-09-09
  Filled 2023-09-09: qty 50

## 2023-09-09 MED ORDER — PROPOFOL 10 MG/ML INTRAVENOUS EMULSION
INTRAVENOUS | Status: AC
Start: 2023-09-09 — End: 2023-09-09
  Filled 2023-09-09: qty 20

## 2023-09-09 MED ORDER — ONDANSETRON HCL (PF) 4 MG/2 ML INJECTION SOLUTION
Freq: Once | INTRAMUSCULAR | Status: DC | PRN
Start: 2023-09-09 — End: 2023-09-09
  Administered 2023-09-09: 4 mg via INTRAVENOUS

## 2023-09-09 MED ORDER — ONDANSETRON HCL (PF) 4 MG/2 ML INJECTION SOLUTION
4.0000 mg | Freq: Once | INTRAMUSCULAR | Status: DC | PRN
Start: 2023-09-09 — End: 2023-09-09

## 2023-09-09 MED ORDER — FENTANYL (PF) 50 MCG/ML INJECTION SOLUTION
Freq: Once | INTRAMUSCULAR | Status: DC | PRN
Start: 2023-09-09 — End: 2023-09-09
  Administered 2023-09-09: 25 ug via INTRAVENOUS
  Administered 2023-09-09: 50 ug via INTRAVENOUS
  Administered 2023-09-09: 25 ug via INTRAVENOUS

## 2023-09-09 MED ORDER — OXYCODONE-ACETAMINOPHEN 5 MG-325 MG TABLET
1.0000 | ORAL_TABLET | Freq: Four times a day (QID) | ORAL | 0 refills | Status: AC | PRN
Start: 2023-09-09 — End: 2023-09-16

## 2023-09-09 MED ORDER — ASPIRIN 81 MG CHEWABLE TABLET
81.0000 mg | CHEWABLE_TABLET | Freq: Two times a day (BID) | ORAL | 0 refills | Status: DC
Start: 2023-09-09 — End: 2023-10-26

## 2023-09-09 MED ORDER — SODIUM CHLORIDE 0.9 % INTRAVENOUS SOLUTION
2.0000 g | Freq: Once | INTRAVENOUS | Status: AC
Start: 2023-09-09 — End: 2023-09-09
  Administered 2023-09-09: 2 g via INTRAVENOUS

## 2023-09-09 MED ORDER — SODIUM CHLORIDE 0.9 % (FLUSH) INJECTION SYRINGE
10.0000 mL | INJECTION | Freq: Three times a day (TID) | INTRAMUSCULAR | Status: DC
Start: 2023-09-09 — End: 2023-09-09

## 2023-09-09 MED ORDER — SODIUM CHLORIDE 0.9 % INTRAVENOUS SOLUTION
INTRAVENOUS | Status: DC
Start: 2023-09-09 — End: 2023-09-09

## 2023-09-09 MED ORDER — ROPIVACAINE (PF) 5 MG/ML (0.5 %) INJECTION SOLUTION
Freq: Once | INTRAMUSCULAR | Status: AC | PRN
Start: 2023-09-09 — End: 2023-09-09
  Administered 2023-09-09: 20 mL

## 2023-09-09 MED ORDER — SUGAMMADEX 100 MG/ML INTRAVENOUS SOLUTION
Freq: Once | INTRAVENOUS | Status: DC | PRN
Start: 2023-09-09 — End: 2023-09-09
  Administered 2023-09-09: 200 mg via INTRAVENOUS

## 2023-09-09 MED ORDER — FENTANYL (PF) 50 MCG/ML INJECTION SOLUTION
INTRAMUSCULAR | Status: AC
Start: 2023-09-09 — End: 2023-09-09
  Filled 2023-09-09: qty 2

## 2023-09-09 MED ORDER — ROCURONIUM 10 MG/ML INTRAVENOUS SOLUTION
Freq: Once | INTRAVENOUS | Status: DC | PRN
Start: 2023-09-09 — End: 2023-09-09
  Administered 2023-09-09: 20 mg via INTRAVENOUS
  Administered 2023-09-09: 50 mg via INTRAVENOUS

## 2023-09-09 MED ORDER — ACETAMINOPHEN 1,000 MG/100 ML (10 MG/ML) INTRAVENOUS SOLUTION
INTRAVENOUS | Status: AC
Start: 2023-09-09 — End: 2023-09-09
  Filled 2023-09-09: qty 100

## 2023-09-09 MED ORDER — HYDROMORPHONE (PF) 0.5 MG/0.5 ML INJECTION SYRINGE
0.5000 mg | INJECTION | INTRAMUSCULAR | Status: DC | PRN
Start: 2023-09-09 — End: 2023-09-09

## 2023-09-09 MED ORDER — HYDROMORPHONE (PF) 0.5 MG/0.5 ML INJECTION SYRINGE
0.2500 mg | INJECTION | INTRAMUSCULAR | Status: DC | PRN
Start: 2023-09-09 — End: 2023-09-09

## 2023-09-09 SURGICAL SUPPLY — 51 items
ADH SKNCLS CYNCRLT EXOFIN HVSC STRL TISS LF  DISP 1G (MED SURG SUPPLIES) ×1 IMPLANT
ANCHOR SUT 4.75MM SWIVELOCK FIBERLINK SUTURETAPE BIOCOMP KNOTLESS PNCH LOOP TENODESIS IMPLANT SYS ×1 IMPLANT
APPL 70% ISPRP 2% CHG 26ML CHLRPRP HI-LT ORNG PREP STRL LF  DISP CLR (MED SURG SUPPLIES) ×2 IMPLANT
BLADE 10 BD RB-BCK CBNSTL SURG TISS STRL LF  DISP (SURGICAL CUTTING SUPPLIES) ×1 IMPLANT
BLADE SHAVER 13CM 4MM COOLCUT 2 CUT POWER SFT TISS RESCT STRL DISP (ENDOSCOPIC SUPPLIES) ×1 IMPLANT
BRACE ORTHO 15-16IN ARMSH 15D LRG SHLDR QUICK RELEASE WST STRAP EXERCISE BALL ABDUCT PILLOW LF (ORTHOPEDICS (NOT IMPLANTS)) ×1 IMPLANT
BURR SHAVER 13CM 5MM CLEARCUT COOLCUT 8 FLUTE OVAL STRL DISP (ENDOSCOPIC SUPPLIES) ×1 IMPLANT
CANNULA ARTHRO 7MM 7CM TWIST-IN SHLDR OBTURATOR TRANSLUC STRL DISP (ENDOSCOPIC SUPPLIES) ×1 IMPLANT
CLOSURE STERI STRIP 1/2X2IN_R1549 50X6/BX (WOUND CARE SUPPLY) ×1 IMPLANT
CONV USE ITEM 107364 - COVER STAND 54X23IN MAYO REG FBRC REINF STD TLSCP FOLD STRL (DRAPE/PACKS/SHEETS/OR TOWEL) ×1 IMPLANT
COVER PROBE 58X5.5IN TELESCOPICAL FOLD ELAS BAND GEL PKT STRL LF  CIV-FLX (MED SURG SUPPLIES) ×1 IMPLANT
DRAPE ADH 51X47IN STRDRP LF  STRL DISP SURG CLR (DRAPE/PACKS/SHEETS/OR TOWEL) ×1 IMPLANT
DRAPE ANTIMIC INCS 13X13IN IOBN2 STRL SURG (DRAPE/PACKS/SHEETS/OR TOWEL) ×1 IMPLANT
DRAPE U SPLT IMPRV 70X60IN STRL SURG POLY 21X4IN (DRAPE/PACKS/SHEETS/OR TOWEL) ×1 IMPLANT
DRESS PETRO 8X1IN CURAD XR COTTON GAUZE NADH OCL IMPREGNATE LF  STRL WHT (WOUND CARE SUPPLY) ×1 IMPLANT
ELECTRODE ESURG 360D PNCL STD 10FT 3/32IN VLAB STRL DISP SMOKE EVAC ATTACH CORD HLSTR (SURGICAL CUTTING SUPPLIES) ×2 IMPLANT
GEL ULTRASOUND CLEAR IMAGE SINGLES STERILE LATEX FREE (MED SURG SUPPLIES) ×1 IMPLANT
GLOVE SURG 6.5 LF  PF SMOOTH BEAD CUF STRL CRM 11.3IN PROTEXIS PI MICRO PLISPRN THK7.9 MIL MICRO (GLOVES AND ACCESSORIES) ×5 IMPLANT
GLOVE SURG 7 LF  PF TXTR STRL GRN PREMIERPRO PLISPRN MICRO THK DISP (GLOVES AND ACCESSORIES) ×1 IMPLANT
GLOVE SURG 7.5 LF  PF BEAD CUF STRL CRM 11.8IN PROTEXIS PI PLISPRN THK9.1 MIL (GLOVES AND ACCESSORIES) ×2 IMPLANT
GLOVE SURGICAL INDICATOR BIOGEL 6 1/2 285MM X 85MM LF PF SMOOTH BEAD CUFF UNDERGLOVE STRL BLUE CURVE (GLOVES AND ACCESSORIES) ×2 IMPLANT
GOWN SURG 2XL L4 REINF HKLP CLSR BRTHBL FILM SLEEVE STRL LF  DISP BLU SIRUS SMS PE 49IN (DRAPE/PACKS/SHEETS/OR TOWEL) ×1 IMPLANT
HANDPC SUCT MEDIVAC YANKAUER BLBS TIP CLR STRL LF  DISP (MED SURG SUPPLIES) ×1 IMPLANT
MANIFLD SUCT NPTN 2 2 STD 4 PORT WASTE MGMT SYS NONST LF  DISP (MED SURG SUPPLIES) ×1 IMPLANT
MANOMETER CVP B-SMRT DISP PERI NERVE BLOCK INJ PRESS (MED SURG SUPPLIES) ×1 IMPLANT
MAT FLR 40X24IN ABS WTPRF BACKSHEET (MED SURG SUPPLIES) ×3 IMPLANT
NEEDLE HYPO  18GA 1.5IN TW ECLIPSE POLYPROP REG BVL LL PVT SHIELD MECH DEHP-FR PNK STRL LF  DISP (MED SURG SUPPLIES) ×1 IMPLANT
NEEDLE HYPO  18GA 1IN REG WL PRCSNGL SS POLYPROP BLUNT FIL LL HUB DEHP-FR RD STRL LF  DISP (MED SURG SUPPLIES) ×2 IMPLANT
NEEDLE HYPO 25GA 1.5IN TW ECLIPSE POLYPROP REG BVL LL PVT SHIELD MECH DEHP-FR ORNG STRL LF DISP (MED SURG SUPPLIES) ×1 IMPLANT
NEEDLE NERVE STIM 4IN 20GA STPLX ULTRA 360 30D BVL INSL ECHGN EXT SET STRL LF (MED SURG SUPPLIES) ×1 IMPLANT
NEEDLE SUT MEGALOADER HD SCORPION (SUTURE AIDS) ×1 IMPLANT
PACK SURGICAL SHOULDER POUCH STERILE LATEX FREE (CUSTOM TRAYS & PACK) ×1 IMPLANT
POSITION POSITION 9IN DONUT HEAD FOAM (MED SURG SUPPLIES) ×2 IMPLANT
PROBE ESURG APOLLORF I90 90 DEG ASPIRATE ABLATOR (MED SURG SUPPLIES) ×1 IMPLANT
PUMP TUBING 13FT CONT WV III DUALWAVE ARTHRO STRL DISP (MED SURG SUPPLIES) ×2 IMPLANT
SLEEVE TRACTION ARM LAT STRL LF (ORTHOPEDICS (NOT IMPLANTS)) ×1 IMPLANT
SOL IRRG 0.9% NACL 3L ARTHMTC LF (MEDICATIONS/SOLUTIONS) ×2 IMPLANT
SPONGE GAUZE 4X4IN MDCHC COTTON 12 PLY LF  STRL DISP WHT (WOUND CARE SUPPLY) ×1 IMPLANT
SPONGE GAUZE 4X4IN MDCHC COTTON 12 PLY TY 7 LF  STRL DISP (WOUND CARE SUPPLY) ×1 IMPLANT
SPONGE LAP 18X18IN STRL (MED SURG SUPPLIES) ×1 IMPLANT
STOPCOCK IV 4W LRG BORE ROT MALE LL ADPR LPD RST DEHP-FR STRL LF (IV TUBING & ACCESSORIES) ×1 IMPLANT
SUTURE .5 CRC SUTURETAPE 40INX1.3MM TAPER END NEEDLE NONAB 26.5MM (SUTURE/WOUND CLOSURE) ×1 IMPLANT
SUTURE 2-0 CP-1 MONOCRYL + 27IN UNDYED MONOF ANBCTRL ABS (SUTURE/WOUND CLOSURE) ×1 IMPLANT
SUTURE 3-0 FS1 ETHILON 18IN BLK MONOF NONAB (SUTURE/WOUND CLOSURE) ×2 IMPLANT
SUTURE 4-0 PS2 MONOCRYL + MTPS 18IN MONOF ANBCTRL ABS (SUTURE/WOUND CLOSURE) ×1 IMPLANT
SUTURE CT1 VICRYL+ 27IN UNDYED BRD ANBCTRL COAT ABS (SUTURE/WOUND CLOSURE) ×1 IMPLANT
SYRINGE LL 5ML LF STRL GRAD N-PYRG DEHP-FR PVC FREE MED DISP CLR (MED SURG SUPPLIES) ×1 IMPLANT
TAPE SURG 10YDX3IN MDPR H SFT CLTH HYPOALL WTR RS LF (WOUND CARE SUPPLY) ×1 IMPLANT
TOWEL 27X17IN COTTON STD PREWASH DELINT HI ABS BLU DISP SURG STRL LF (DRAPE/PACKS/SHEETS/OR TOWEL) ×1 IMPLANT
TUBING SUCT 12FT 3/16IN UNIV NCDTV FEMALE CONN STRL LF (MED SURG SUPPLIES) ×2 IMPLANT
precision saw blade w/10.0mm stop ×1 IMPLANT

## 2023-09-09 NOTE — Anesthesia Procedure Notes (Signed)
 Benjamin Mcmahon    Block: Peripheral Block    Performed By:   Macario Carls Provider:  Allene Dillon, CRNA  Performing Provider:  Denzil Hughes III, CRNA       Sedation        Blocks  Right brachial plexus Interscalene  Type of Block: single shot    Ultrasound was used to identify the nerve structure(s) along with needle placement during the procedure  Image:  Images saved via Haiku  Diagnosis: shoulder pain   Indication: Requested by surgeon and Acute post operative pain   Tourniquet location: Room 1203.  Requesting Surgeon: Howie Ill, DO  Pre anesthesia checklist: H&P updated and consent obtained, Patient positioned, Patient monitors applied, Timeout performed:, Site verified, Emergency drugs and equipment available and anesthesia consent given  Technique(See MAR for doses)  Approach:  Landmark, other and Peripheral Block   Preprocedure hand washing was performed sterile field maintained    Nerve stimulator used :with twitch faded at the current intensity of 0.47mA  Sterile Skin Prep : cap, Draped, Hand hygiene performed, Sterile field established, Sterile drape, Mask, Sterile gloves, Sterile technique, Sterilely prepped and draped and Supplies assembled on sterile field    Skin prepped with: Chlorhexidine gluconate    Skin Local  Local amount 1 mL. lidocaine 1%   Needle  Needle type: Stimuplex     Needle Gauge: 20G   Needle length: 4 in.  Needle localization: anatomical landmarks, nerve stimulator and ultrasound guidance  Number of attempts: 1      Catheter          Site      Medications  Medications:  dexAMETHasone (PF) 10 mg/mL injection - Injection   5 mg - 09/09/2023 10:17:00 AM  ROPivacaine PF (NAROPIN) 0.5% injection - Infiltration   20 mL - 09/09/2023 10:17:00 AM  Assessment  Injection assessment: incremental injection, local visualized surrounding nerve on ultrasound and negative aspiration for heme  Paresthesia pain: none    Heart Rate changed: No    Events     Patient tolerance of procedure: tolerated  well, no immediate complications        NOTES: Pressure cap used for entire procedure. Pressure remained WNL for entirety of block.

## 2023-09-09 NOTE — Anesthesia Postprocedure Evaluation (Signed)
 Anesthesia Post Op Evaluation    Patient: Benjamin Mcmahon  Procedure(s):  ARTHROSCOPY SHOULDER WITH SUBACROMIAL DECOMPRESSION  OPEN REPAIR SHOLDER MUMFORD  ARTHROSCOPY SHOULDER BICEPS WITH TENODESIS    Last Vitals:Temperature: 36.1 C (97 F) (09/09/23 1408)  Heart Rate: 66 (09/09/23 1408)  BP (Non-Invasive): (!) 156/76 (09/09/23 1408)  Respiratory Rate: 13 (09/09/23 1408)  SpO2: 100 % (09/09/23 1408)    No notable events documented.      Patient location during evaluation: PACU       Patient participation: complete - patient participated  Level of consciousness: awake and alert and responsive to verbal stimuli    Pain score: 0  Pain management: adequate  Airway patency: patent    Anesthetic complications: no  Cardiovascular status: acceptable  Respiratory status: acceptable  Hydration status: acceptable  Patient post-procedure temperature: Pt Normothermic   PONV Status: Absent

## 2023-09-09 NOTE — Discharge Instructions (Signed)
 SURGICAL DISCHARGE INSTRUCTIONS     Dr. Denton Lank, Alycia Rossetti, DO  performed your ARTHROSCOPY SHOULDER WITH SUBACROMIAL DECOMPRESSION, OPEN REPAIR SHOLDER MUMFORD, ARTHROSCOPY SHOULDER BICEPS WITH TENODESIS today at the [facility]    Dr. Howie Ill  Office Hours: M-F 8a-4p  [phone]      SIGNS AND SYMPTOMS OF A WOUND / INCISION INFECTION   Be sure to watch for the following:  Increase in redness or red streaks near or around the wound or incision.  Increase in pain that is intense or severe and cannot be relieved by the pain medication that your doctor has given you.  Increase in swelling that cannot be relieved by elevation of a body part, or by applying ice, if permitted.  Increase in drainage, or if yellow / Phenix in color and smells bad. This could be on a dressing or a cast.  Increase in fever for longer than 24 hours, or an increase that is higher than 101 degrees Fahrenheit (normal body temperature is 98 degrees Fahrenheit). The incision may feel warm to the touch.    **CALL YOUR DOCTOR IF ONE OR MORE OF THESE SIGNS / SYMPTOMS SHOULD OCCUR.    ANESTHESIA INFORMATION   ANESTHESIA -- ADULT PATIENTS:  You have received intravenous sedation / general anesthesia, and you may feel drowsy and light-headed for several hours. You may even experience some forgetfulness of the procedure. DO NOT DRIVE A MOTOR VEHICLE or perform any activity requiring complete alertness or coordination until you feel fully awake in about 24-48 hours. Do not drink alcoholic beverages for at least 24 hours. Do not stay alone, you must have a responsible adult available to be with you. You may also experience a dry mouth or nausea for 24 hours. This is a normal side effect and will disappear as the effects of the medication wear off.    REMEMBER   If you experience any difficulty breathing, chest pain, bleeding that you feel is excessive, persistent nausea or vomiting or for any other concerns:  Call  your physician Dr.  Howie Ill, DO  at [phone] or [phone] You may also ask to have the general doctor on call paged. They are available to you 24 hours a day.    SPECIAL INSTRUCTIONS / COMMENTS       FOLLOW-UP APPOINTMENTS   Please call your surgeon's office at the number listed about  to schedule a date / time of return.

## 2023-09-09 NOTE — H&P (Signed)
 [facility]  [address]  [place] [address]       Name: Benjamin Mcmahon MRN:  [id number]   Date: 07/24/2023 Age: 33 y.o.        CHIEF COMPLAINT:  Right shoulder pain        HISTORY OF PRESENT ILLNESS:    The patient is a 32 y.o. male who presents with right shoulder pain that has been present for the past year so.  He states that he does not recall any specific injury he is into competitive arm wrestling.  He states that he did have 1 turned him in where somebody jerked his arm and he has had a little bit of pain since that time he states the pain is worse with overhead motion and cross-body motion.  He complains of pain mostly at the [facility] joint but some into the subdeltoid region.  He will take naproxen as needed for the pain.  Continues to do a lot of body building but most overhead activity seem to bother him more.  He states the steroid helped for about 4 weeks and then wore off.  He states that is worse with overhead motion better with rest he states the Celebrex is not helping his pain enough.  He is interested in pursuing surgical options for this.       Past Medical History:    Past Medical History:   Diagnosis Date    Anxiety     HTN (hypertension)     Wears glasses       Past Surgical History:    Past Surgical History:   Procedure Laterality Date    HX TONSILLECTOMY      HX WISDOM TEETH EXTRACTION        Current Medications:   No current outpatient medications on file.      Allergies  No Known Allergies   Social history  Social History     Socioeconomic History    Marital status: Married     Spouse name: Not on file    Number of children: 0    Years of education: Not on file    Highest education level: Not on file   Occupational History    Not on file   Tobacco Use    Smoking status: Former     Types: Cigarettes     Passive exposure: Past    Smokeless tobacco: Current     Types: Snuff   Vaping Use    Vaping status: Every Day    Substances: Nicotine, Flavoring    Devices:  Refillable tank   Substance and Sexual Activity    Alcohol use: Yes     Comment: weekends     Drug use: Yes     Types: Marijuana, Anabolic Steroids     Comment: TRT    Sexual activity: Yes     Partners: Female   Other Topics Concern    Not on file   Social History Narrative    Right handed      Social Determinants of Health     Financial Resource Strain: Low Risk  (08/12/2022)    Financial Resource Strain     SDOH Financial: No   Transportation Needs: Low Risk  (08/12/2022)    Transportation Needs     SDOH Transportation: No   Social Connections: Low Risk  (08/12/2022)    Social Connections     SDOH Social Isolation: 5 or more times a week   Intimate  Partner Violence: Low Risk  (08/12/2022)    Intimate Partner Violence     SDOH Domestic Violence: No   Housing Stability: Low Risk  (08/12/2022)    Housing Stability     SDOH Housing Situation: I have housing.     SDOH Housing Worry: No      Family History:   Family Medical History:    None             REVIEW OF SYSTEMS as discussed in HPI     PHYSICAL EXAM:    VITALS:  Wt 104 kg (230 lb)   BMI 31.19 kg/m       Constitutional: Oriented to person, place, and time; Answer questions appropriately  HENT: Head: Normocephalic and atraumatic.   Eyes: EOMI, PEARL  Neck: Supple, trachea midline  Cardiovascular: Brisk capillary refill to all extremities, extremities well perfused  Pulmonary/Chest: No increased work of breathing, no cough  Abdominal: Non-tender, non-distended  Neurologic:  Awake, alert and oriented in three planes. No gross deficits   Musculoskeletal:  Right upper extremity    His skin is intact circumferentially no erythema or warmth he is tender to palpation over the [facility] joint he has pain with cross-body motion of the arm at the [facility] joint.  He has positive Neer impingement sign.  His compartments are all soft and compressible.  He has 5/5 strength with supraspinatus infraspinatus teres minor and subscapularis testing.  He is neurovascularly intact distally he has a  negative Spurling compression test.       DATA:    CBC:   Lab Results   Component Value Date    WBC 6.1 09/06/2023    RBC 6.05 09/06/2023    HGB 17.5 09/06/2023    HCT 50.8 09/06/2023    MCV 84.0 09/06/2023    MCH 28.9 09/06/2023    MCHC 34.4 09/06/2023    MPV 10.5 09/06/2023     Radiology Review:   X-ray of the right shoulder was obtained and independently reviewed by myself from 06/10/2022 demonstrating no fractures or dislocations he has some mild AC arthritis with joint space narrowing and some osteolysis of the distal clavicle.  The glenohumeral joint is well-maintained no significant osteoarthritis no superior migration of the humeral head.    MRI of the right shoulder was reviewed from an outside hospital from 05/26/2023 demonstrating no tear in the rotator cuff tendons.  Labrum is intact.  There is some inflammation present to the supraspinatus.  There is AC arthritis with cyst present to the distal clavicle and acromion.    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 He has failed conservative treatment for his AC arthritis including physical therapy, anti-inflammatories, and a steroid injection.  He does have significant AC arthritis I think this is the main cause of his problem but he also has a impingement syndrome.  I did recommend a right shoulder arthroscopy with rotator cuff debridement subacromial decompression and open Mumford procedure.  We discussed the risks and benefits of the surgery in detail including but not limited to neurovascular injury, wound complication, infection, continued pain, need for further surgery, deep vein thrombosis, PE, bleeding, tendon injury, stiffness, tendon injury, and risks of anesthesia including but not limited to death he understood and wished to proceed we will plan for right shoulder arthroscopy with limited debridement and open Mumford  procedure.    Referral note and/or  ER note was reviewed in detail    Orders Placed This Encounter    INSERT & MAINTAIN PERIPHERAL IV ACCESS    PERIPHERAL IV DRESSING CHANGE EVERY 96 HOURS AND PRN    NS flush syringe    NS flush syringe    ceFAZolin (ANCEF) 2 g in NS 100 mL IVPB    BUPivacaine PF (MARCAINE) 0.25% injection    NS premix infusion      No follow-ups on file.     Clemente Cushing, DO  09/09/2023 08:03

## 2023-09-09 NOTE — Anesthesia Transfer of Care (Signed)
 ANESTHESIA TRANSFER OF CARE   Benjamin Mcmahon is a 33 y.o. ,male, Weight: 104 kg (230 lb)   had Procedure(s):  ARTHROSCOPY SHOULDER WITH SUBACROMIAL DECOMPRESSION  OPEN REPAIR SHOLDER MUMFORD  ARTHROSCOPY SHOULDER BICEPS WITH TENODESIS  performed  09/09/23   Primary Service: Howie Ill, DO    Past Medical History:   Diagnosis Date   . Anxiety    . HTN (hypertension)    . Wears glasses       Allergy History as of 09/09/23        No Known Allergies                  I completed my transfer of care / handoff to the receiving personnel during which we discussed:  Access, Airway, All key/critical aspects of case discussed, Analgesia, Antibiotics, Expectation of post procedure, Fluids/Product, Gave opportunity for questions and acknowledgement of understanding, Labs and PMHx      Post Location: PACU                        Additional Info:Patient transferred to PACU, connected to monitor, vitals stable                                   Last OR Temp: Temperature: 36.8 C (98.3 F)  ABG:  POTASSIUM   Date Value Ref Range Status   09/06/2023 4.1 3.5 - 5.1 mmol/L Final     CALCIUM   Date Value Ref Range Status   09/06/2023 9.2 8.6 - 10.2 mg/dL Final     Comment:     Gadolinium-containing contrast can interfere with calcium measurement.       Calculated P Axis   Date Value Ref Range Status   09/06/2023 48 degrees Final     Calculated R Axis   Date Value Ref Range Status   09/06/2023 86 degrees Final     Calculated T Axis   Date Value Ref Range Status   09/06/2023 37 degrees Final     Airway:* No LDAs found *  Blood pressure (!) 130/59, pulse 57, temperature 36.8 C (98.3 F), resp. rate 17, height 1.829 m (6'), weight 104 kg (230 lb), SpO2 95%.

## 2023-09-09 NOTE — Anesthesia Preprocedure Evaluation (Addendum)
 ANESTHESIA PRE-OP EVALUATION  Planned Procedure: ARTHROSCOPY SHOULDER WITH SUBACROMIAL DECOMPRESSION (Right)  OPEN REPAIR SHOLDER MUMFORD (Right)  Review of Systems     anesthesia history negative     patient summary reviewed  nursing notes reviewed        Pulmonary   Vapes daily   Marijuana use daily , current smoker and Smoked in last 24 hours,   Cardiovascular    Hypertension, ECG reviewed and Patient denies recent chest pain, shortness of breath, or syncopal episodes.    EKG: Normal sinus rhythm with sinus arrhythmia  Normal ECG  No previous ECGs available  Confirmed by Micky Albee ([id number]) on 09/07/2023 3:47:17 PM    History of HTN , Previously took Lisinopril but no longer taking meds    ,       GI/Hepatic/Renal    Denies issues with GERD  and GERD        Endo/Other   neg endo/other ROS,       Neuro/Psych/MS    anxiety     Cancer    negative hematology/oncology ROS,                     Physical Assessment      Airway       Mallampati: II    TM distance: >3 FB    Neck ROM: full  Mouth Opening: good.  Facial hair  Beard        Dental                    Pulmonary    Comment: Vapes daily   Marijuana use daily   Breath sounds clear to auscultation       Cardiovascular    Rhythm: regular  Rate: Normal       Other findings              Plan  ASA 2     Planned anesthesia type: general     general anesthesia with endotracheal tube intubation      PONV Plan:  I plan to administer pharmcologic prophalaxis antiemetics  POV PLAN:   plan for postoperative opioid use    SLEEP APNEA  Education provided regarding risk of obstructive sleep apnea    Additional Plans: Pre-op Block    Intravenous induction     Anesthesia issues/risks discussed are: PONV, Dental Injuries, Eye /Visual Loss, Post-op Pain Management, Post-op Agitation/Tantrum, Stroke, Aspiration, Difficult Airway, Sore Throat, Intraoperative Awareness/ Recall, Cardiac Events/MI, Post-op Cognitive Dysfunction and Nerve Injuries.  Anesthetic plan and risks discussed with  patient  signed consent obtained      Use of blood products discussed with patient who consented to blood products.      Patient's NPO status is appropriate for Anesthesia.           Plan discussed with CRNA.

## 2023-09-09 NOTE — OR Surgeon (Signed)
 [facility]  Operative Note     PATIENT NAME:  Benjamin Mcmahon, Benjamin Mcmahon  MRN:  [id number]  DOB:  1990/09/19    Date of Procedure:  09/09/2023  Preoperative Diagnosis:  Right rotator cuff impingement syndrome, right AC arthritis   Postoperative Diagnoses:  * No post-op diagnosis entered *   Procedure Performed: 1.  Right arthroscopic rotator cuff repair to the subscapularis  2. Right biceps tenodesis  3. Right shoulder limited debridement including the anterior superior labrum and subacromial space  4. Right subacromial decompression  5. Right open Mumford procedure   Implants:  Implant Name Type Inv. Item Serial No. Manufacturer Lot No. LRB No. Used Action   ANCHOR SUT 4.75MM SWIVELOCK FIBERLINK SUTURETAPE BIOCOMP KNOTLESS PNCH LOOP TENODESIS IMPLANT SYS - [id number]  ANCHOR SUT 4.75MM SWIVELOCK Francina Ames SUTURETAPE BIOCOMP KNOTLESS PNCH LOOP TENODESIS IMPLANT SYS  [employer] [id number] A Right 1 Implanted      Surgeon: Howie Ill, DO   Anesthesia: CRNA: Denzil Hughes III, CRNA; Tora Kindred, CRNA   OR Staff: Circulator: Reeves Forth, RN  Relief Scrub: Caplinger, [place], SA-C  Scrub Person: Orlene Och, LPN  FIRST ASSIST: Tomma Lightning, RN   Anticoagulation: NA    Antibiotics:  2 g of Ancef  Estimated Blood Loss: Minimal   Specimens: * No specimens in log *   Complications: None immediate  Indications For Procedure:  Benjamin Mcmahon  is a very pleasant  33 y.o. male  presenting  for right AC arthritis as well as impingement syndrome he had failed conservative treatment for this including steroid injections physical therapy anti-inflammatories and activity modification he was wishing to proceed with right arthroscopic rotator cuff debridement with open Mumford procedure Risks, benefits, indications, and complications of procedure were discussed, and informed signed consent obtained.  Intraoperative Findings:   AC arthritis with complete joint space loss and spurring of the [facility] joint upper  subscapularis tear SLAP tear with fraying of the biceps tendon fraying of the anterior superior labrum significant hemorrhagic bursal tissue  Description of Procedure     The patient was seen and identified in the preoperative holding area the correct extremity was marked and agreed on by patient and staff patient was then taken to the operative room transferred to the operative bed and sedated under the care of the anesthesia team he was then placed into the lateral decubitus position all bony prominences were well padded the right upper extremity was then prepped and draped in normal sterile fashion a surgical time-out was performed and agreed on by everyone in the room.  Next, portals were marked out for posterior anterior and lateral portals.  A standard posterior portal was then made and the scope and trocar were inserted into the glenohumeral joint with these.  There was a SLAP tear with complete detachment of the superior labrum at the biceps insertion there was fraying of the biceps tendon.  The upper subscapularis was torn off the lower subscap was intact.  The cartilage of the glenoid and the humeral head were intact.  Next a anterior portal was made through the rotator interval using a spinal needle for localization.  An incision was then made and the shaver was inserted into the glenohumeral joint the labrum was debrided the biceps was pulled into the joint it was subluxing medially out of the groove.  I also probed the superior labrum in it pulled off the entire portion of the glenoid.  The upper portion of the subscapularis was debrided.  At this  point I decided to perform a biceps tenodesis and a biceps tenodesis was performed using a loop  and tact technique.  The biceps was then released from its insertion on the superior labrum.  Next, a horizontal mattress fashion was passed through the upper border of the subscapularis using the scorpion suture Passer.  The lesser tuberosity was debrided down to  healthy bleeding bone.  Next, biceps tendon and the subscapularis sutures were inserted into the anchor and then inserted into the lesser tuberosity we tensioned these appropriately and this pull the subscapularis back up to its insertion on the lesser tuberosity very nicely.    At this point the shaver was again inserted and the superior labrum was debrided down to healthy stable edge.  The anterior and posterior labrum were also debrided using arthroscopic shaver.  At this point this is scope was removed from the glenohumeral joint and inserted in his of the subacromial space there was a significant amount of hemorrhagic bursal tissue a lateral portal was made and using arthroscopic shaver and Bovie cautery the subacromial space was debrided.  The undersurface of the acromion was outlined using the arthroscopic Bovie.  There was an anterior lateral spur.  We bovied all the way medially to the clavicle a spinal knee was then inserted into the acromioclavicular joint for localization.  The bur was then inserted in the undersurface of the acromion was shaved off to a smooth stable edge.  This allowed good space between the rotator cuff and the undersurface of the acromion.  The shoulder was copiously irrigated out.  Next, the rotator cuff was probed and found to be stable with no tearing.    This point all the fluid in the scope and trocar were removed from the shoulder.  We then made a saber incision over the distal clavicle the clavipectoral fascia was then split when periosteal flaps were elevated off the distal clavicle exposing the acromioclavicular joint.    Next, using a roller we measured about a cm of the distal clavicle and using an oscillating saw after placing retractors both anteriorly and posteriorly we removed the distal clavicle.  This was removed easily in 1 piece there was about a cm of space between the distal clavicle and the acromion.  Care was taken to preserve the posterior capsule there was no  instability of the distal clavicle.  Next, the wound was copiously irrigated out with normal saline.  The fascia and capsule were closed with an 0 Vicryl suture followed by 2-0 Monocryl for subcutaneous tissue and a running subcuticular 4-0 for the saber incision the arthroscopic portals were closed with 3-0 nylon.  A sterile bulky dressing was placed followed by an abduction sling.  The patient was then awakened from anesthesia transferred back to the hospital bed and taken to the PACU in stable condition.    Postoperative plan    Patient will recover in the PACU he will then be discharged home he will follow up in my office in 2 weeks for suture removal he should be nonweightbearing to the right upper extremity and should not use the right upper extremity.  He should keep it in his sling at all times other than for gentle range motion of the elbow wrist and hand and for bathing.  He will be in his sling for 6 weeks and then we will get him out of the sling to start some gentle therapy.  He will be started on 81 mg aspirin twice daily for  deep vein thrombosis prophylaxis.  Clemente Cushing, DO    This note was partially generated using MModal Fluency Direct system, and there may be some incorrect words, spellings, and punctuation that were not noted in checking the note before saving.

## 2023-09-22 ENCOUNTER — Other Ambulatory Visit: Payer: Self-pay

## 2023-09-22 ENCOUNTER — Ambulatory Visit: Payer: Self-pay | Attending: Physician Assistant | Admitting: Physician Assistant

## 2023-09-22 DIAGNOSIS — Z4789 Encounter for other orthopedic aftercare: Secondary | ICD-10-CM | POA: Insufficient documentation

## 2023-09-22 DIAGNOSIS — Z4802 Encounter for removal of sutures: Secondary | ICD-10-CM

## 2023-09-22 DIAGNOSIS — Z9889 Other specified postprocedural states: Secondary | ICD-10-CM

## 2023-09-25 NOTE — Progress Notes (Signed)
 ORTHOPEDICS, [facility]  [address]  [place] [address]  Operated by [facility]    Name: Benjamin Mcmahon MRN:  [id number]   Date: 09/22/2023 DOB:  [birth year] (33 y.o.)        CHIEF COMPLAINT:  Chief Complaint              Post Op 2wk S/P RT subscapularis rotator cuff repair , mumford , tenodesis, decompression DOS 09/09/2023            HPI:  Benjamin Mcmahon is a 33 y.o. male who presents today for 2 week postoperative follow-up of right rotator cuff repair by Dr. Manfred Seed.  Date of surgery was 09/09/2023 procedures performed were   1.  Right arthroscopic rotator cuff repair to subscapularis.   2. Right biceps tenodesis.  3. Right shoulder limited debridement including the anterior superior labrum and subacromial space  4. Right subacromial decompression  5. Right open Mumford procedure    Patient is doing well he has been compliant with utilizing his sling.  He has been using aspirin  81 mg b.i.d. for deep vein thrombosis prophylaxis reports 1/10 pain.    PAST MEDICAL HISTORY:  Patient Active Problem List   Diagnosis    Anxiety    GERD (gastroesophageal reflux disease)    Hypertension     PAST SURGICAL HISTORY:  Past Surgical History:   Procedure Laterality Date    ARTHROSCOPY SHOULDER BICEPS WITH TENODESIS Right 09/09/2023    Performed by Clemente Cushing, DO at [facility] OR MAIN    ARTHROSCOPY SHOULDER WITH SUBACROMIAL DECOMPRESSION Right 09/09/2023    Performed by Clemente Cushing, DO at [facility] OR MAIN    HX TONSILLECTOMY      HX WISDOM TEETH EXTRACTION      OPEN REPAIR SHOLDER MUMFORD Right 09/09/2023    Performed by Clemente Cushing, DO at [facility] OR MAIN     CURRENT MEDICATIONS:   Current Outpatient Medications   Medication Sig Dispense Refill    aspirin  81 mg Oral Tablet, Chewable Chew 1 Tablet (81 mg total) Twice daily for 30 days 60 Tablet 0    fluticasone  propionate (FLONASE ) 50 mcg/actuation Nasal Spray, Suspension Administer 1 Spray into each nostril Once a day 16 g 0     Levocetirizine (XYZAL) 5 mg Oral Tablet Take 1 Tablet (5 mg total) by mouth Every evening Indications: inflammation of the nose due to an allergy 90 Tablet 3    testosterone  enanthate (DELATESTRYL) 200 mg/mL IntraMUSCULAR Oil Inject 0.25 mL (50 mg total) into the muscle Every 7 days       No current facility-administered medications for this visit.     ALLERGIES:  Patient has no known allergies.  SOCIAL HISTORY:   Social History     Socioeconomic History    Marital status: Married    Number of children: 0   Tobacco Use    Smoking status: Former     Types: Cigarettes     Passive exposure: Past    Smokeless tobacco: Current     Types: Snuff   Vaping Use    Vaping status: Every Day    Substances: Nicotine, Flavoring    Devices: Refillable tank   Substance and Sexual Activity    Alcohol use: Yes     Comment: weekends     Drug use: Yes     Types: Marijuana, Anabolic Steroids     Comment: TRT    Sexual activity: Yes     Partners:  Female   Social History Narrative    Right handed      Social Determinants of Health     Financial Resource Strain: Low Risk  (08/12/2022)    Financial Resource Strain     SDOH Financial: No   Transportation Needs: Low Risk  (08/12/2022)    Transportation Needs     SDOH Transportation: No   Social Connections: Low Risk  (08/12/2022)    Social Connections     SDOH Social Isolation: 5 or more times a week   Intimate Partner Violence: Low Risk  (08/12/2022)    Intimate Partner Violence     SDOH Domestic Violence: No   Housing Stability: Low Risk  (08/12/2022)    Housing Stability     SDOH Housing Situation: I have housing.     SDOH Housing Worry: No     FAMILY HISTORY:  Family Medical History:    None             PHYSICAL EXAM:    Vital signs: There were no vitals taken for this visit.      General: Normal appearance, no obvious distress, alert and oriented to person, place, and time  CV:  Arm are warm and well perfused   Abdomen: Soft, non obese  Neuro: No focal neuro deficit, normal coordination,  normal muscle tone  Skin: Warm and dry, no ecchymosis, erythema, warmth, or induration, no obvious rash  Psych: Appropriate, normal mood and affect    Musculoskeletal:   Examination of the right shoulder reveals well-approximated and well-healing portal type incisions consistent with shoulder arthroscopy.  He also has a an anterior superior incision over the acromion consistent with Mumford procedure all incisions are well approximated and healing well.           ASSESSMENT/ PLAN:    Benjamin Mcmahon was seen today for post op.    Diagnoses and all orders for this visit:    S/P RT subscapularis rotator cuff repair , mumford , tenodesis, decompression DOS 09/09/2023  -     Refer to [facility]; Future    Visit for suture removal    Other orders  -     FOLLOW-UP: ORTHOPEDICS - [facility] (2ND FLR - [address]) - [place],          Plan:  Patient is sutures removed in the office today tolerated it well.  He should remain in a sling over the next 4 weeks removing it for hygiene purposes and light range of motion exercises of his elbow he should avoid forward flexion and abduction he should continue aspirin  81 mg b.i.d. for deep vein thrombosis prophylaxis for another 2 weeks he will follow up with Dr. Manfred Seed in the office in 4 weeks.  We did provide him with a an order for physical therapy however he should not begin until after his 6 week postoperative follow up.    Restrictions:  as above    Return in about 4 weeks (around 10/20/2023).     Radiographs next visit:  None          L. Monetta Angst II PA-C  Orthopedics and Sports Medicine  [facility]      I discussed the case with the mid-level I did not personally see the patient See mid-level's note for additional details. My findings/participation are 2 weeks from arthroscopic subscapularis repair open Mumford, subacromial decompression and biceps tenodesis.  Doing relatively well.  He can work  on gentle range motion of the elbow wrist and hand should remain in his sling for another 4 weeks in 4 weeks we will start into physical therapy to regain motion and strength to his arm.  Follow up with me in 4 weeks for repeat check    Clemente Cushing, DO

## 2023-10-26 ENCOUNTER — Other Ambulatory Visit: Payer: Self-pay

## 2023-10-26 ENCOUNTER — Ambulatory Visit
Payer: Self-pay | Attending: Student in an Organized Health Care Education/Training Program | Admitting: Student in an Organized Health Care Education/Training Program

## 2023-10-26 DIAGNOSIS — Z9889 Other specified postprocedural states: Secondary | ICD-10-CM | POA: Insufficient documentation

## 2023-10-26 DIAGNOSIS — T23012A Burn of unspecified degree of left thumb (nail), initial encounter: Secondary | ICD-10-CM | POA: Insufficient documentation

## 2023-10-26 DIAGNOSIS — Z4789 Encounter for other orthopedic aftercare: Secondary | ICD-10-CM | POA: Insufficient documentation

## 2023-10-26 MED ORDER — DOXYCYCLINE HYCLATE 100 MG CAPSULE
100.0000 mg | ORAL_CAPSULE | Freq: Two times a day (BID) | ORAL | 0 refills | Status: AC
Start: 2023-10-26 — End: 2023-11-05

## 2023-10-26 NOTE — Progress Notes (Signed)
 Subjective  Six weeks from right subscapularis rotator cuff repair Mumford procedure and biceps tenodesis he is doing well.  He states that his pain is improving he has 0/10 pain today he has been compliant with the sling use so far.  He is supposed to start into physical therapy next week.  He has not done any lifting with the arm.  He also burned his left thumb.  He states he has had some drainage and warmth from it.    Physical exam  Right arthroscopic and Mumford incision clean and dry intact no signs of infection or dehiscence.  No signs of infection  ROM appropriate he can abduct to about 90 forward flex to 75 internal rotation to his stomach external rotation to about neutral  NVI    Left upper extremity    There is a healing burn over the dorsum of the left thumb.  No erythema or warmth there is some granulation tissue present.  No drainage from it at this time.  He can flex and extend the IP joint and MCP joint.    Assessment and plan  Post-op 6 weeks from arthroscopic rotator cuff repair with biceps tenodesis subacromial decompression and open Mumford procedure doing well.  At that is point we will start into physical therapy to regain motion and strength.  He should still not lift anything heavier than about 1 lb.  He can start to work on both passive and active range motion he does not need the sling anymore he can also discontinue the baby aspirin  at this point.  At this point I do not think he has a infection in the thumb but we will send in some antibiotics to prevent further infection.  Send in some doxycycline if he develops any increased warmth or redness from the thumb he should contact me for repeat evaluation.  10/26/2023  @MEMD @

## 2023-12-07 ENCOUNTER — Other Ambulatory Visit: Payer: Self-pay

## 2023-12-07 ENCOUNTER — Ambulatory Visit
Payer: Self-pay | Attending: Student in an Organized Health Care Education/Training Program | Admitting: Student in an Organized Health Care Education/Training Program

## 2023-12-07 VITALS — Ht 72.0 in | Wt 230.0 lb

## 2023-12-07 DIAGNOSIS — Z9889 Other specified postprocedural states: Secondary | ICD-10-CM | POA: Insufficient documentation

## 2023-12-08 NOTE — Progress Notes (Signed)
 Subjective  He is just under 3 months from a right arthroscopic rotator cuff repair biceps tenodesis and Mumford procedure.  He is having 0/10 pain.  He is working with physical therapy on regaining motion he has not started much strengthening yet.    Physical exam  Right arthroscopic and Mumford incision clean and dry intact no signs of infection or dehiscence  ROM appropriate he has full range motion to his shoulder without pain.  NVI    Assessment and plan  Post-op he is 3 months from right arthroscopic rotator cuff repair with biceps tenodesis subacromial decompression and Mumford procedure.  He can start to work on strengthening at this point.  He should start lower weight and slowly progress up.  He did have an upper subscapularis repair and so this will take some time to fully heal.  I will be about 6 months before he can return to unrestricted activities.  He will see me back in another 3 months at which time we will release him back to all activities.  12/08/2023  @MEMD @

## 2024-03-25 ENCOUNTER — Ambulatory Visit: Payer: Self-pay | Admitting: Student in an Organized Health Care Education/Training Program

## 2024-03-25 ENCOUNTER — Ambulatory Visit (INDEPENDENT_AMBULATORY_CARE_PROVIDER_SITE_OTHER)
Admission: RE | Admit: 2024-03-25 | Discharge: 2024-03-25 | Disposition: A | Payer: Self-pay | Source: Ambulatory Visit | Attending: Student in an Organized Health Care Education/Training Program

## 2024-03-25 ENCOUNTER — Other Ambulatory Visit: Payer: Self-pay

## 2024-03-25 DIAGNOSIS — Q7649 Other congenital malformations of spine, not associated with scoliosis: Secondary | ICD-10-CM

## 2024-03-25 DIAGNOSIS — Z9889 Other specified postprocedural states: Secondary | ICD-10-CM

## 2024-03-25 NOTE — Progress Notes (Signed)
 [facility]  [address]   [place] [address]  Operated by [facility]     Name: Benjamin Mcmahon MRN:  [id number]   Date: 03/25/2024 Age: 33 y.o.        CHIEF COMPLAINT: Re-eval (48mo S/P RT subscapularis RCR , mumford , tenodesis, decompression DOS 09/09/2023 )        HISTORY OF PRESENT ILLNESS:    Benjamin Mcmahon is a 33 y.o. White male who presents for six-month follow up from a right subscapularis rotator cuff repair Mumford procedure and biceps tenodesis.  He is doing very well he has a 1/10 pain he is very happy with his results so far he states it feels way stronger and has a less pain in his then it did prior to the surgery he is very happy with how he is doing.  He is having significant lumbar back pain.  He states that it has been present since high school in his progressively worsening.  He has had to miss work for the past few days due to it.  He was seen at [facility] and was placed on a Medrol  Dosepak and Voltaren with a minimal improvement in his symptoms     Past Medical History:    Past Medical History:   Diagnosis Date    Anxiety     HTN (hypertension)     Wears glasses       Past Surgical History:    Past Surgical History:   Procedure Laterality Date    HX TONSILLECTOMY      HX WISDOM TEETH EXTRACTION        Current Medications:   Current Outpatient Medications   Medication Sig    cyclobenzaprine  (FLEXERIL ) 5 mg Oral Tablet Take by mouth    diclofenac sodium (VOLTAREN) 75 mg Oral Tablet, Delayed Release (E.C.) Take 1 Tablet (75 mg total) by mouth    fluticasone  propionate (FLONASE ) 50 mcg/actuation Nasal Spray, Suspension Administer 1 Spray into each nostril Once a day    Levocetirizine (XYZAL) 5 mg Oral Tablet Take 1 Tablet (5 mg total) by mouth Every evening Indications: inflammation of the nose due to an allergy    Methylprednisolone  (MEDROL  DOSEPACK) 4 mg Oral Tablets, Dose Pack Take 1 Tablet (4 mg total) by mouth Per  Instructions    testosterone  enanthate (DELATESTRYL) 200 mg/mL IntraMUSCULAR Oil Inject 0.25 mL (50 mg total) into the muscle Every 7 days      Allergies  Allergies[1]   Social history  Social History     Socioeconomic History    Marital status: Married     Spouse name: Not on file    Number of children: 0    Years of education: Not on file    Highest education level: Not on file   Occupational History    Not on file   Tobacco Use    Smoking status: Former     Types: Cigarettes     Passive exposure: Past    Smokeless tobacco: Current     Types: Snuff   Vaping Use    Vaping status: Every Day    Substances: Nicotine, Flavoring    Devices: Refillable tank   Substance and Sexual Activity    Alcohol use: Yes     Comment: weekends     Drug use: Yes     Types: Marijuana, Anabolic Steroids     Comment: TRT    Sexual activity: Yes     Partners:  Female   Other Topics Concern    Not on file   Social History Narrative    Right handed      Social Determinants of Health     Financial Resource Strain: Low Risk (08/12/2022)    Financial Resource Strain     SDOH Financial: No   Transportation Needs: Low Risk (08/12/2022)    Transportation Needs     SDOH Transportation: No   Social Connections: Low Risk (08/12/2022)    Social Connections     SDOH Social Isolation: 5 or more times a week   Intimate Partner Violence: Low Risk (08/12/2022)    Intimate Partner Violence     SDOH Domestic Violence: No   Housing Stability: Low Risk (08/12/2022)    Housing Stability     SDOH Housing Situation: I have housing.     SDOH Housing Worry: No      Family History:   Family Medical History:    None             REVIEW OF SYSTEMS as discussed in HPI     PHYSICAL EXAM:    VITALS:  There were no vitals filed for this visit.   Constitutional: Oriented to person, place, and time; Answer questions appropriately  HENT: Head: Normocephalic and atraumatic.   Eyes: EOMI, PEARL  Neck: Supple, trachea midline  Cardiovascular: Brisk capillary refill to all extremities,  extremities well perfused  Pulmonary/Chest: No increased work of breathing, no cough  Abdominal: Non-tender, non-distended  Neurologic:  Awake, alert and oriented in three planes. No gross deficits   Musculoskeletal:  Right upper extremity    Well-healed incisions over the distal clavicle and around the shoulder no signs of infection or dehiscence his compartments are all soft and compressible 5/5 strength with supraspinatus infraspinatus teres minor and subscapularis testing.  He does have a slight Popeye deformity to the right biceps.       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 S/P RT subscapularis rotator cuff repair , mumford , tenodesis, decompression DOS 09/09/2023  Z98.890               PLAN:  Patient is doing well from his arthroscopic surgery.  At this point he can increase his activity as tolerated he has no restrictions from my standpoint.  We will see him back on an as needed basis.  I do feel as if he will continue to improve up to about a year from surgery.  If he has any worsening pain or symptoms she should contact me for evaluation otherwise we will see him back as needed as far as his lumbar spine we will put in a referral to Dr. Oneita for workup and evaluation of this        Orders Placed This Encounter  Refer to [facility]      Return if symptoms worsen or fail to improve.     Bernardino Cunning, DO  03/25/2024 08:25                [1] No Known Allergies

## 2024-03-29 ENCOUNTER — Other Ambulatory Visit (HOSPITAL_COMMUNITY): Payer: Self-pay | Admitting: Student in an Organized Health Care Education/Training Program

## 2024-03-29 ENCOUNTER — Encounter (HOSPITAL_COMMUNITY): Payer: Self-pay

## 2024-04-07 ENCOUNTER — Ambulatory Visit (HOSPITAL_BASED_OUTPATIENT_CLINIC_OR_DEPARTMENT_OTHER): Payer: Self-pay | Admitting: Physical Medicine & Rehabilitation

## 2024-06-17 ENCOUNTER — Ambulatory Visit (HOSPITAL_COMMUNITY): Payer: Self-pay | Admitting: Student in an Organized Health Care Education/Training Program
